# Patient Record
Sex: Female | Born: 1950 | Race: White | Hispanic: No | Smoking: Never smoker
Health system: Southern US, Community
[De-identification: ages and names within clinical notes are randomized; demographics above are authoritative.]

## PROBLEM LIST (undated history)

## (undated) DIAGNOSIS — I10 Essential (primary) hypertension: Secondary | ICD-10-CM

## (undated) DIAGNOSIS — E119 Type 2 diabetes mellitus without complications: Secondary | ICD-10-CM

## (undated) DIAGNOSIS — M1990 Unspecified osteoarthritis, unspecified site: Secondary | ICD-10-CM

## (undated) DIAGNOSIS — E669 Obesity, unspecified: Secondary | ICD-10-CM

## (undated) DIAGNOSIS — E7800 Pure hypercholesterolemia, unspecified: Secondary | ICD-10-CM

## (undated) DIAGNOSIS — N182 Chronic kidney disease, stage 2 (mild): Secondary | ICD-10-CM

## (undated) DIAGNOSIS — N39490 Overflow incontinence: Secondary | ICD-10-CM

## (undated) HISTORY — PX: TUBAL LIGATION: SHX77

## (undated) HISTORY — PX: CATARACT EXTRACTION W/ INTRAOCULAR LENS  IMPLANT, BILATERAL: SHX1307

## (undated) HISTORY — PX: TUMOR EXCISION: SHX421

## (undated) HISTORY — PX: ABDOMINAL HYSTERECTOMY: SHX81

## (undated) HISTORY — PX: CORONARY ANGIOPLASTY WITH STENT PLACEMENT: SHX49

## (undated) HISTORY — PX: DILATION AND CURETTAGE OF UTERUS: SHX78

## (undated) HISTORY — PX: CARPAL TUNNEL RELEASE: SHX101

---

## 2006-07-01 ENCOUNTER — Emergency Department (HOSPITAL_COMMUNITY): Admission: EM | Admit: 2006-07-01 | Discharge: 2006-07-01 | Payer: Self-pay | Admitting: Emergency Medicine

## 2010-11-15 LAB — DIFFERENTIAL
Basophils Absolute: 0
Eosinophils Absolute: 0.1
Eosinophils Relative: 1
Lymphocytes Relative: 29
Monocytes Absolute: 0.5

## 2010-11-15 LAB — BASIC METABOLIC PANEL
BUN: 12
CO2: 28
Chloride: 100
Glucose, Bld: 362 — ABNORMAL HIGH
Potassium: 4.1
Sodium: 134 — ABNORMAL LOW

## 2010-11-15 LAB — URINALYSIS, ROUTINE W REFLEX MICROSCOPIC
Bilirubin Urine: NEGATIVE
Ketones, ur: NEGATIVE
Nitrite: NEGATIVE
Protein, ur: NEGATIVE
pH: 7

## 2010-11-15 LAB — CBC
HCT: 41.7
Hemoglobin: 14.2
MCHC: 33.9
MCV: 84.5
Platelets: 225
RDW: 13.2

## 2014-04-21 DIAGNOSIS — E1021 Type 1 diabetes mellitus with diabetic nephropathy: Secondary | ICD-10-CM | POA: Diagnosis not present

## 2014-04-21 DIAGNOSIS — R251 Tremor, unspecified: Secondary | ICD-10-CM | POA: Diagnosis not present

## 2014-04-21 DIAGNOSIS — Z6841 Body Mass Index (BMI) 40.0 and over, adult: Secondary | ICD-10-CM | POA: Diagnosis not present

## 2014-04-21 DIAGNOSIS — I10 Essential (primary) hypertension: Secondary | ICD-10-CM | POA: Diagnosis not present

## 2014-04-21 DIAGNOSIS — J309 Allergic rhinitis, unspecified: Secondary | ICD-10-CM | POA: Diagnosis not present

## 2014-04-21 DIAGNOSIS — N183 Chronic kidney disease, stage 3 (moderate): Secondary | ICD-10-CM | POA: Diagnosis not present

## 2014-04-21 DIAGNOSIS — E780 Pure hypercholesterolemia: Secondary | ICD-10-CM | POA: Diagnosis not present

## 2014-04-21 DIAGNOSIS — D72829 Elevated white blood cell count, unspecified: Secondary | ICD-10-CM | POA: Diagnosis not present

## 2014-04-21 DIAGNOSIS — M159 Polyosteoarthritis, unspecified: Secondary | ICD-10-CM | POA: Diagnosis not present

## 2014-04-21 DIAGNOSIS — R609 Edema, unspecified: Secondary | ICD-10-CM | POA: Diagnosis not present

## 2014-04-21 DIAGNOSIS — R7989 Other specified abnormal findings of blood chemistry: Secondary | ICD-10-CM | POA: Diagnosis not present

## 2014-05-17 DIAGNOSIS — E1149 Type 2 diabetes mellitus with other diabetic neurological complication: Secondary | ICD-10-CM | POA: Diagnosis not present

## 2014-05-17 DIAGNOSIS — Z794 Long term (current) use of insulin: Secondary | ICD-10-CM | POA: Diagnosis not present

## 2014-05-17 DIAGNOSIS — E11649 Type 2 diabetes mellitus with hypoglycemia without coma: Secondary | ICD-10-CM | POA: Diagnosis not present

## 2014-05-17 DIAGNOSIS — I10 Essential (primary) hypertension: Secondary | ICD-10-CM | POA: Diagnosis not present

## 2014-05-17 DIAGNOSIS — E1165 Type 2 diabetes mellitus with hyperglycemia: Secondary | ICD-10-CM | POA: Diagnosis not present

## 2014-05-19 DIAGNOSIS — Z79899 Other long term (current) drug therapy: Secondary | ICD-10-CM | POA: Diagnosis not present

## 2014-05-19 DIAGNOSIS — R251 Tremor, unspecified: Secondary | ICD-10-CM | POA: Diagnosis not present

## 2014-06-03 DIAGNOSIS — N183 Chronic kidney disease, stage 3 (moderate): Secondary | ICD-10-CM | POA: Diagnosis not present

## 2014-06-21 DIAGNOSIS — I2510 Atherosclerotic heart disease of native coronary artery without angina pectoris: Secondary | ICD-10-CM | POA: Diagnosis not present

## 2014-06-21 DIAGNOSIS — E1121 Type 2 diabetes mellitus with diabetic nephropathy: Secondary | ICD-10-CM | POA: Diagnosis not present

## 2014-06-21 DIAGNOSIS — I10 Essential (primary) hypertension: Secondary | ICD-10-CM | POA: Diagnosis not present

## 2014-06-21 DIAGNOSIS — Z9181 History of falling: Secondary | ICD-10-CM | POA: Diagnosis not present

## 2014-06-21 DIAGNOSIS — R609 Edema, unspecified: Secondary | ICD-10-CM | POA: Diagnosis not present

## 2014-06-21 DIAGNOSIS — J309 Allergic rhinitis, unspecified: Secondary | ICD-10-CM | POA: Diagnosis not present

## 2014-06-21 DIAGNOSIS — Z1389 Encounter for screening for other disorder: Secondary | ICD-10-CM | POA: Diagnosis not present

## 2014-06-21 DIAGNOSIS — E780 Pure hypercholesterolemia: Secondary | ICD-10-CM | POA: Diagnosis not present

## 2014-06-21 DIAGNOSIS — N183 Chronic kidney disease, stage 3 (moderate): Secondary | ICD-10-CM | POA: Diagnosis not present

## 2014-06-21 DIAGNOSIS — R7989 Other specified abnormal findings of blood chemistry: Secondary | ICD-10-CM | POA: Diagnosis not present

## 2014-06-21 DIAGNOSIS — R251 Tremor, unspecified: Secondary | ICD-10-CM | POA: Diagnosis not present

## 2014-06-21 DIAGNOSIS — M159 Polyosteoarthritis, unspecified: Secondary | ICD-10-CM | POA: Diagnosis not present

## 2014-06-27 DIAGNOSIS — Z13820 Encounter for screening for osteoporosis: Secondary | ICD-10-CM | POA: Diagnosis not present

## 2014-06-27 DIAGNOSIS — M8589 Other specified disorders of bone density and structure, multiple sites: Secondary | ICD-10-CM | POA: Diagnosis not present

## 2014-07-01 DIAGNOSIS — N183 Chronic kidney disease, stage 3 (moderate): Secondary | ICD-10-CM | POA: Diagnosis not present

## 2014-07-25 DIAGNOSIS — E119 Type 2 diabetes mellitus without complications: Secondary | ICD-10-CM | POA: Diagnosis not present

## 2014-07-25 DIAGNOSIS — H25813 Combined forms of age-related cataract, bilateral: Secondary | ICD-10-CM | POA: Diagnosis not present

## 2014-07-25 DIAGNOSIS — H04123 Dry eye syndrome of bilateral lacrimal glands: Secondary | ICD-10-CM | POA: Diagnosis not present

## 2014-08-15 DIAGNOSIS — E780 Pure hypercholesterolemia: Secondary | ICD-10-CM | POA: Diagnosis not present

## 2014-08-15 DIAGNOSIS — I2510 Atherosclerotic heart disease of native coronary artery without angina pectoris: Secondary | ICD-10-CM | POA: Diagnosis not present

## 2014-08-15 DIAGNOSIS — I10 Essential (primary) hypertension: Secondary | ICD-10-CM | POA: Diagnosis not present

## 2014-08-15 DIAGNOSIS — E119 Type 2 diabetes mellitus without complications: Secondary | ICD-10-CM | POA: Diagnosis not present

## 2014-08-23 DIAGNOSIS — E1149 Type 2 diabetes mellitus with other diabetic neurological complication: Secondary | ICD-10-CM | POA: Diagnosis not present

## 2014-08-23 DIAGNOSIS — E1142 Type 2 diabetes mellitus with diabetic polyneuropathy: Secondary | ICD-10-CM | POA: Diagnosis not present

## 2014-08-23 DIAGNOSIS — E785 Hyperlipidemia, unspecified: Secondary | ICD-10-CM | POA: Diagnosis not present

## 2014-08-23 DIAGNOSIS — Z794 Long term (current) use of insulin: Secondary | ICD-10-CM | POA: Diagnosis not present

## 2014-08-23 DIAGNOSIS — I10 Essential (primary) hypertension: Secondary | ICD-10-CM | POA: Diagnosis not present

## 2014-08-25 DIAGNOSIS — R251 Tremor, unspecified: Secondary | ICD-10-CM | POA: Diagnosis not present

## 2014-09-21 DIAGNOSIS — M159 Polyosteoarthritis, unspecified: Secondary | ICD-10-CM | POA: Diagnosis not present

## 2014-09-21 DIAGNOSIS — E1121 Type 2 diabetes mellitus with diabetic nephropathy: Secondary | ICD-10-CM | POA: Diagnosis not present

## 2014-09-21 DIAGNOSIS — E780 Pure hypercholesterolemia: Secondary | ICD-10-CM | POA: Diagnosis not present

## 2014-09-21 DIAGNOSIS — I10 Essential (primary) hypertension: Secondary | ICD-10-CM | POA: Diagnosis not present

## 2014-09-21 DIAGNOSIS — R251 Tremor, unspecified: Secondary | ICD-10-CM | POA: Diagnosis not present

## 2014-09-21 DIAGNOSIS — Z6841 Body Mass Index (BMI) 40.0 and over, adult: Secondary | ICD-10-CM | POA: Diagnosis not present

## 2014-09-21 DIAGNOSIS — R7989 Other specified abnormal findings of blood chemistry: Secondary | ICD-10-CM | POA: Diagnosis not present

## 2014-09-21 DIAGNOSIS — J309 Allergic rhinitis, unspecified: Secondary | ICD-10-CM | POA: Diagnosis not present

## 2014-09-21 DIAGNOSIS — I2510 Atherosclerotic heart disease of native coronary artery without angina pectoris: Secondary | ICD-10-CM | POA: Diagnosis not present

## 2014-09-21 DIAGNOSIS — R609 Edema, unspecified: Secondary | ICD-10-CM | POA: Diagnosis not present

## 2014-09-21 DIAGNOSIS — N183 Chronic kidney disease, stage 3 (moderate): Secondary | ICD-10-CM | POA: Diagnosis not present

## 2014-09-30 DIAGNOSIS — I10 Essential (primary) hypertension: Secondary | ICD-10-CM | POA: Diagnosis not present

## 2014-11-10 DIAGNOSIS — E7800 Pure hypercholesterolemia, unspecified: Secondary | ICD-10-CM | POA: Diagnosis not present

## 2014-11-10 DIAGNOSIS — E1121 Type 2 diabetes mellitus with diabetic nephropathy: Secondary | ICD-10-CM | POA: Diagnosis not present

## 2014-11-10 DIAGNOSIS — J309 Allergic rhinitis, unspecified: Secondary | ICD-10-CM | POA: Diagnosis not present

## 2014-11-10 DIAGNOSIS — N61 Mastitis without abscess: Secondary | ICD-10-CM | POA: Diagnosis not present

## 2014-11-10 DIAGNOSIS — I2510 Atherosclerotic heart disease of native coronary artery without angina pectoris: Secondary | ICD-10-CM | POA: Diagnosis not present

## 2014-11-10 DIAGNOSIS — R251 Tremor, unspecified: Secondary | ICD-10-CM | POA: Diagnosis not present

## 2014-11-10 DIAGNOSIS — R7989 Other specified abnormal findings of blood chemistry: Secondary | ICD-10-CM | POA: Diagnosis not present

## 2014-11-10 DIAGNOSIS — Z23 Encounter for immunization: Secondary | ICD-10-CM | POA: Diagnosis not present

## 2014-11-10 DIAGNOSIS — M159 Polyosteoarthritis, unspecified: Secondary | ICD-10-CM | POA: Diagnosis not present

## 2014-11-10 DIAGNOSIS — R609 Edema, unspecified: Secondary | ICD-10-CM | POA: Diagnosis not present

## 2014-11-10 DIAGNOSIS — Z6841 Body Mass Index (BMI) 40.0 and over, adult: Secondary | ICD-10-CM | POA: Diagnosis not present

## 2014-11-24 DIAGNOSIS — R609 Edema, unspecified: Secondary | ICD-10-CM | POA: Diagnosis not present

## 2014-11-24 DIAGNOSIS — Z6841 Body Mass Index (BMI) 40.0 and over, adult: Secondary | ICD-10-CM | POA: Diagnosis not present

## 2014-11-24 DIAGNOSIS — N183 Chronic kidney disease, stage 3 (moderate): Secondary | ICD-10-CM | POA: Diagnosis not present

## 2014-11-24 DIAGNOSIS — M159 Polyosteoarthritis, unspecified: Secondary | ICD-10-CM | POA: Diagnosis not present

## 2014-11-24 DIAGNOSIS — R7989 Other specified abnormal findings of blood chemistry: Secondary | ICD-10-CM | POA: Diagnosis not present

## 2014-11-24 DIAGNOSIS — I10 Essential (primary) hypertension: Secondary | ICD-10-CM | POA: Diagnosis not present

## 2014-11-24 DIAGNOSIS — J309 Allergic rhinitis, unspecified: Secondary | ICD-10-CM | POA: Diagnosis not present

## 2014-11-24 DIAGNOSIS — R251 Tremor, unspecified: Secondary | ICD-10-CM | POA: Diagnosis not present

## 2014-11-24 DIAGNOSIS — E1121 Type 2 diabetes mellitus with diabetic nephropathy: Secondary | ICD-10-CM | POA: Diagnosis not present

## 2014-11-24 DIAGNOSIS — N61 Mastitis without abscess: Secondary | ICD-10-CM | POA: Diagnosis not present

## 2014-11-24 DIAGNOSIS — I2510 Atherosclerotic heart disease of native coronary artery without angina pectoris: Secondary | ICD-10-CM | POA: Diagnosis not present

## 2014-11-24 DIAGNOSIS — E7800 Pure hypercholesterolemia, unspecified: Secondary | ICD-10-CM | POA: Diagnosis not present

## 2014-12-05 DIAGNOSIS — Z1231 Encounter for screening mammogram for malignant neoplasm of breast: Secondary | ICD-10-CM | POA: Diagnosis not present

## 2014-12-19 DIAGNOSIS — R251 Tremor, unspecified: Secondary | ICD-10-CM | POA: Diagnosis not present

## 2014-12-19 DIAGNOSIS — R609 Edema, unspecified: Secondary | ICD-10-CM | POA: Diagnosis not present

## 2014-12-19 DIAGNOSIS — I10 Essential (primary) hypertension: Secondary | ICD-10-CM | POA: Diagnosis not present

## 2014-12-19 DIAGNOSIS — N183 Chronic kidney disease, stage 3 (moderate): Secondary | ICD-10-CM | POA: Diagnosis not present

## 2014-12-19 DIAGNOSIS — Z6841 Body Mass Index (BMI) 40.0 and over, adult: Secondary | ICD-10-CM | POA: Diagnosis not present

## 2014-12-19 DIAGNOSIS — R7989 Other specified abnormal findings of blood chemistry: Secondary | ICD-10-CM | POA: Diagnosis not present

## 2014-12-19 DIAGNOSIS — J309 Allergic rhinitis, unspecified: Secondary | ICD-10-CM | POA: Diagnosis not present

## 2014-12-19 DIAGNOSIS — E1121 Type 2 diabetes mellitus with diabetic nephropathy: Secondary | ICD-10-CM | POA: Diagnosis not present

## 2014-12-19 DIAGNOSIS — M159 Polyosteoarthritis, unspecified: Secondary | ICD-10-CM | POA: Diagnosis not present

## 2014-12-19 DIAGNOSIS — E7800 Pure hypercholesterolemia, unspecified: Secondary | ICD-10-CM | POA: Diagnosis not present

## 2014-12-19 DIAGNOSIS — I2510 Atherosclerotic heart disease of native coronary artery without angina pectoris: Secondary | ICD-10-CM | POA: Diagnosis not present

## 2014-12-29 DIAGNOSIS — Z794 Long term (current) use of insulin: Secondary | ICD-10-CM | POA: Diagnosis not present

## 2014-12-29 DIAGNOSIS — E785 Hyperlipidemia, unspecified: Secondary | ICD-10-CM | POA: Diagnosis not present

## 2014-12-29 DIAGNOSIS — I10 Essential (primary) hypertension: Secondary | ICD-10-CM | POA: Diagnosis not present

## 2014-12-29 DIAGNOSIS — E1165 Type 2 diabetes mellitus with hyperglycemia: Secondary | ICD-10-CM | POA: Diagnosis not present

## 2015-01-31 DIAGNOSIS — I10 Essential (primary) hypertension: Secondary | ICD-10-CM | POA: Diagnosis not present

## 2015-02-02 DIAGNOSIS — H25813 Combined forms of age-related cataract, bilateral: Secondary | ICD-10-CM | POA: Diagnosis not present

## 2015-02-02 DIAGNOSIS — E113293 Type 2 diabetes mellitus with mild nonproliferative diabetic retinopathy without macular edema, bilateral: Secondary | ICD-10-CM | POA: Diagnosis not present

## 2015-03-17 DIAGNOSIS — H25812 Combined forms of age-related cataract, left eye: Secondary | ICD-10-CM | POA: Diagnosis not present

## 2015-03-17 DIAGNOSIS — H40033 Anatomical narrow angle, bilateral: Secondary | ICD-10-CM | POA: Diagnosis not present

## 2015-03-21 DIAGNOSIS — R609 Edema, unspecified: Secondary | ICD-10-CM | POA: Diagnosis not present

## 2015-03-21 DIAGNOSIS — I10 Essential (primary) hypertension: Secondary | ICD-10-CM | POA: Diagnosis not present

## 2015-03-21 DIAGNOSIS — R251 Tremor, unspecified: Secondary | ICD-10-CM | POA: Diagnosis not present

## 2015-03-21 DIAGNOSIS — E1121 Type 2 diabetes mellitus with diabetic nephropathy: Secondary | ICD-10-CM | POA: Diagnosis not present

## 2015-03-21 DIAGNOSIS — Z9181 History of falling: Secondary | ICD-10-CM | POA: Diagnosis not present

## 2015-03-21 DIAGNOSIS — Z1389 Encounter for screening for other disorder: Secondary | ICD-10-CM | POA: Diagnosis not present

## 2015-03-21 DIAGNOSIS — N183 Chronic kidney disease, stage 3 (moderate): Secondary | ICD-10-CM | POA: Diagnosis not present

## 2015-03-21 DIAGNOSIS — J309 Allergic rhinitis, unspecified: Secondary | ICD-10-CM | POA: Diagnosis not present

## 2015-03-21 DIAGNOSIS — M159 Polyosteoarthritis, unspecified: Secondary | ICD-10-CM | POA: Diagnosis not present

## 2015-03-21 DIAGNOSIS — R945 Abnormal results of liver function studies: Secondary | ICD-10-CM | POA: Diagnosis not present

## 2015-03-21 DIAGNOSIS — E7800 Pure hypercholesterolemia, unspecified: Secondary | ICD-10-CM | POA: Diagnosis not present

## 2015-03-21 DIAGNOSIS — I2510 Atherosclerotic heart disease of native coronary artery without angina pectoris: Secondary | ICD-10-CM | POA: Diagnosis not present

## 2015-04-05 DIAGNOSIS — I10 Essential (primary) hypertension: Secondary | ICD-10-CM | POA: Diagnosis not present

## 2015-04-05 DIAGNOSIS — E1142 Type 2 diabetes mellitus with diabetic polyneuropathy: Secondary | ICD-10-CM | POA: Diagnosis not present

## 2015-04-05 DIAGNOSIS — E782 Mixed hyperlipidemia: Secondary | ICD-10-CM | POA: Diagnosis not present

## 2015-04-05 DIAGNOSIS — Z6841 Body Mass Index (BMI) 40.0 and over, adult: Secondary | ICD-10-CM | POA: Diagnosis not present

## 2015-04-05 DIAGNOSIS — Z794 Long term (current) use of insulin: Secondary | ICD-10-CM | POA: Diagnosis not present

## 2015-04-11 DIAGNOSIS — N183 Chronic kidney disease, stage 3 (moderate): Secondary | ICD-10-CM | POA: Diagnosis not present

## 2015-04-11 DIAGNOSIS — E785 Hyperlipidemia, unspecified: Secondary | ICD-10-CM | POA: Diagnosis not present

## 2015-04-11 DIAGNOSIS — M1990 Unspecified osteoarthritis, unspecified site: Secondary | ICD-10-CM | POA: Diagnosis not present

## 2015-04-11 DIAGNOSIS — I129 Hypertensive chronic kidney disease with stage 1 through stage 4 chronic kidney disease, or unspecified chronic kidney disease: Secondary | ICD-10-CM | POA: Diagnosis not present

## 2015-04-11 DIAGNOSIS — M810 Age-related osteoporosis without current pathological fracture: Secondary | ICD-10-CM | POA: Diagnosis not present

## 2015-04-11 DIAGNOSIS — Z79899 Other long term (current) drug therapy: Secondary | ICD-10-CM | POA: Diagnosis not present

## 2015-04-11 DIAGNOSIS — Z7984 Long term (current) use of oral hypoglycemic drugs: Secondary | ICD-10-CM | POA: Diagnosis not present

## 2015-04-11 DIAGNOSIS — I2510 Atherosclerotic heart disease of native coronary artery without angina pectoris: Secondary | ICD-10-CM | POA: Diagnosis not present

## 2015-04-11 DIAGNOSIS — Z8673 Personal history of transient ischemic attack (TIA), and cerebral infarction without residual deficits: Secondary | ICD-10-CM | POA: Diagnosis not present

## 2015-04-11 DIAGNOSIS — E1140 Type 2 diabetes mellitus with diabetic neuropathy, unspecified: Secondary | ICD-10-CM | POA: Diagnosis not present

## 2015-04-11 DIAGNOSIS — H25812 Combined forms of age-related cataract, left eye: Secondary | ICD-10-CM | POA: Diagnosis not present

## 2015-04-13 DIAGNOSIS — I10 Essential (primary) hypertension: Secondary | ICD-10-CM | POA: Diagnosis not present

## 2015-04-13 DIAGNOSIS — R251 Tremor, unspecified: Secondary | ICD-10-CM | POA: Diagnosis not present

## 2015-04-13 DIAGNOSIS — M79604 Pain in right leg: Secondary | ICD-10-CM | POA: Diagnosis not present

## 2015-04-13 DIAGNOSIS — J309 Allergic rhinitis, unspecified: Secondary | ICD-10-CM | POA: Diagnosis not present

## 2015-04-13 DIAGNOSIS — N183 Chronic kidney disease, stage 3 (moderate): Secondary | ICD-10-CM | POA: Diagnosis not present

## 2015-04-13 DIAGNOSIS — E7800 Pure hypercholesterolemia, unspecified: Secondary | ICD-10-CM | POA: Diagnosis not present

## 2015-04-13 DIAGNOSIS — M79661 Pain in right lower leg: Secondary | ICD-10-CM | POA: Diagnosis not present

## 2015-04-13 DIAGNOSIS — R945 Abnormal results of liver function studies: Secondary | ICD-10-CM | POA: Diagnosis not present

## 2015-04-13 DIAGNOSIS — E1121 Type 2 diabetes mellitus with diabetic nephropathy: Secondary | ICD-10-CM | POA: Diagnosis not present

## 2015-04-13 DIAGNOSIS — E1140 Type 2 diabetes mellitus with diabetic neuropathy, unspecified: Secondary | ICD-10-CM | POA: Diagnosis not present

## 2015-04-13 DIAGNOSIS — I2510 Atherosclerotic heart disease of native coronary artery without angina pectoris: Secondary | ICD-10-CM | POA: Diagnosis not present

## 2015-04-13 DIAGNOSIS — M79651 Pain in right thigh: Secondary | ICD-10-CM | POA: Diagnosis not present

## 2015-04-13 DIAGNOSIS — R609 Edema, unspecified: Secondary | ICD-10-CM | POA: Diagnosis not present

## 2015-04-13 DIAGNOSIS — M159 Polyosteoarthritis, unspecified: Secondary | ICD-10-CM | POA: Diagnosis not present

## 2015-04-18 DIAGNOSIS — H353131 Nonexudative age-related macular degeneration, bilateral, early dry stage: Secondary | ICD-10-CM | POA: Diagnosis not present

## 2015-04-20 DIAGNOSIS — R609 Edema, unspecified: Secondary | ICD-10-CM | POA: Diagnosis not present

## 2015-04-20 DIAGNOSIS — J309 Allergic rhinitis, unspecified: Secondary | ICD-10-CM | POA: Diagnosis not present

## 2015-04-20 DIAGNOSIS — I10 Essential (primary) hypertension: Secondary | ICD-10-CM | POA: Diagnosis not present

## 2015-04-20 DIAGNOSIS — E7800 Pure hypercholesterolemia, unspecified: Secondary | ICD-10-CM | POA: Diagnosis not present

## 2015-04-20 DIAGNOSIS — R945 Abnormal results of liver function studies: Secondary | ICD-10-CM | POA: Diagnosis not present

## 2015-04-20 DIAGNOSIS — I2510 Atherosclerotic heart disease of native coronary artery without angina pectoris: Secondary | ICD-10-CM | POA: Diagnosis not present

## 2015-04-20 DIAGNOSIS — R251 Tremor, unspecified: Secondary | ICD-10-CM | POA: Diagnosis not present

## 2015-04-20 DIAGNOSIS — M25561 Pain in right knee: Secondary | ICD-10-CM | POA: Diagnosis not present

## 2015-04-20 DIAGNOSIS — E1121 Type 2 diabetes mellitus with diabetic nephropathy: Secondary | ICD-10-CM | POA: Diagnosis not present

## 2015-04-20 DIAGNOSIS — E1140 Type 2 diabetes mellitus with diabetic neuropathy, unspecified: Secondary | ICD-10-CM | POA: Diagnosis not present

## 2015-04-20 DIAGNOSIS — M159 Polyosteoarthritis, unspecified: Secondary | ICD-10-CM | POA: Diagnosis not present

## 2015-04-20 DIAGNOSIS — N183 Chronic kidney disease, stage 3 (moderate): Secondary | ICD-10-CM | POA: Diagnosis not present

## 2015-04-24 DIAGNOSIS — M1711 Unilateral primary osteoarthritis, right knee: Secondary | ICD-10-CM | POA: Diagnosis not present

## 2015-05-09 DIAGNOSIS — Z79899 Other long term (current) drug therapy: Secondary | ICD-10-CM | POA: Diagnosis not present

## 2015-05-09 DIAGNOSIS — N183 Chronic kidney disease, stage 3 (moderate): Secondary | ICD-10-CM | POA: Diagnosis not present

## 2015-05-09 DIAGNOSIS — Z8673 Personal history of transient ischemic attack (TIA), and cerebral infarction without residual deficits: Secondary | ICD-10-CM | POA: Diagnosis not present

## 2015-05-09 DIAGNOSIS — Z955 Presence of coronary angioplasty implant and graft: Secondary | ICD-10-CM | POA: Diagnosis not present

## 2015-05-09 DIAGNOSIS — E1142 Type 2 diabetes mellitus with diabetic polyneuropathy: Secondary | ICD-10-CM | POA: Diagnosis not present

## 2015-05-09 DIAGNOSIS — M810 Age-related osteoporosis without current pathological fracture: Secondary | ICD-10-CM | POA: Diagnosis not present

## 2015-05-09 DIAGNOSIS — H25811 Combined forms of age-related cataract, right eye: Secondary | ICD-10-CM | POA: Diagnosis not present

## 2015-05-09 DIAGNOSIS — M1990 Unspecified osteoarthritis, unspecified site: Secondary | ICD-10-CM | POA: Diagnosis not present

## 2015-05-09 DIAGNOSIS — Z7984 Long term (current) use of oral hypoglycemic drugs: Secondary | ICD-10-CM | POA: Diagnosis not present

## 2015-05-09 DIAGNOSIS — E785 Hyperlipidemia, unspecified: Secondary | ICD-10-CM | POA: Diagnosis not present

## 2015-05-09 DIAGNOSIS — I129 Hypertensive chronic kidney disease with stage 1 through stage 4 chronic kidney disease, or unspecified chronic kidney disease: Secondary | ICD-10-CM | POA: Diagnosis not present

## 2015-05-09 DIAGNOSIS — I2510 Atherosclerotic heart disease of native coronary artery without angina pectoris: Secondary | ICD-10-CM | POA: Diagnosis not present

## 2015-05-16 DIAGNOSIS — E7800 Pure hypercholesterolemia, unspecified: Secondary | ICD-10-CM | POA: Diagnosis not present

## 2015-05-16 DIAGNOSIS — R945 Abnormal results of liver function studies: Secondary | ICD-10-CM | POA: Diagnosis not present

## 2015-05-16 DIAGNOSIS — E1140 Type 2 diabetes mellitus with diabetic neuropathy, unspecified: Secondary | ICD-10-CM | POA: Diagnosis not present

## 2015-05-16 DIAGNOSIS — J309 Allergic rhinitis, unspecified: Secondary | ICD-10-CM | POA: Diagnosis not present

## 2015-05-16 DIAGNOSIS — N183 Chronic kidney disease, stage 3 (moderate): Secondary | ICD-10-CM | POA: Diagnosis not present

## 2015-05-16 DIAGNOSIS — M159 Polyosteoarthritis, unspecified: Secondary | ICD-10-CM | POA: Diagnosis not present

## 2015-05-16 DIAGNOSIS — R251 Tremor, unspecified: Secondary | ICD-10-CM | POA: Diagnosis not present

## 2015-05-16 DIAGNOSIS — E1121 Type 2 diabetes mellitus with diabetic nephropathy: Secondary | ICD-10-CM | POA: Diagnosis not present

## 2015-05-16 DIAGNOSIS — I2510 Atherosclerotic heart disease of native coronary artery without angina pectoris: Secondary | ICD-10-CM | POA: Diagnosis not present

## 2015-05-16 DIAGNOSIS — R609 Edema, unspecified: Secondary | ICD-10-CM | POA: Diagnosis not present

## 2015-05-16 DIAGNOSIS — I10 Essential (primary) hypertension: Secondary | ICD-10-CM | POA: Diagnosis not present

## 2015-05-31 DIAGNOSIS — I10 Essential (primary) hypertension: Secondary | ICD-10-CM | POA: Diagnosis not present

## 2015-06-08 DIAGNOSIS — H524 Presbyopia: Secondary | ICD-10-CM | POA: Diagnosis not present

## 2015-06-13 DIAGNOSIS — I10 Essential (primary) hypertension: Secondary | ICD-10-CM | POA: Diagnosis not present

## 2015-06-13 DIAGNOSIS — E1140 Type 2 diabetes mellitus with diabetic neuropathy, unspecified: Secondary | ICD-10-CM | POA: Diagnosis not present

## 2015-06-13 DIAGNOSIS — E1121 Type 2 diabetes mellitus with diabetic nephropathy: Secondary | ICD-10-CM | POA: Diagnosis not present

## 2015-06-13 DIAGNOSIS — E7800 Pure hypercholesterolemia, unspecified: Secondary | ICD-10-CM | POA: Diagnosis not present

## 2015-06-13 DIAGNOSIS — N183 Chronic kidney disease, stage 3 (moderate): Secondary | ICD-10-CM | POA: Diagnosis not present

## 2015-06-13 DIAGNOSIS — Z6841 Body Mass Index (BMI) 40.0 and over, adult: Secondary | ICD-10-CM | POA: Diagnosis not present

## 2015-06-13 DIAGNOSIS — J309 Allergic rhinitis, unspecified: Secondary | ICD-10-CM | POA: Diagnosis not present

## 2015-06-13 DIAGNOSIS — M159 Polyosteoarthritis, unspecified: Secondary | ICD-10-CM | POA: Diagnosis not present

## 2015-06-13 DIAGNOSIS — I2510 Atherosclerotic heart disease of native coronary artery without angina pectoris: Secondary | ICD-10-CM | POA: Diagnosis not present

## 2015-06-13 DIAGNOSIS — R609 Edema, unspecified: Secondary | ICD-10-CM | POA: Diagnosis not present

## 2015-06-13 DIAGNOSIS — R945 Abnormal results of liver function studies: Secondary | ICD-10-CM | POA: Diagnosis not present

## 2015-06-13 DIAGNOSIS — R251 Tremor, unspecified: Secondary | ICD-10-CM | POA: Diagnosis not present

## 2015-07-11 DIAGNOSIS — E119 Type 2 diabetes mellitus without complications: Secondary | ICD-10-CM | POA: Diagnosis not present

## 2015-07-11 DIAGNOSIS — I10 Essential (primary) hypertension: Secondary | ICD-10-CM | POA: Diagnosis not present

## 2015-07-11 DIAGNOSIS — Z794 Long term (current) use of insulin: Secondary | ICD-10-CM | POA: Diagnosis not present

## 2015-07-11 DIAGNOSIS — I5022 Chronic systolic (congestive) heart failure: Secondary | ICD-10-CM | POA: Diagnosis not present

## 2015-07-11 DIAGNOSIS — E7800 Pure hypercholesterolemia, unspecified: Secondary | ICD-10-CM | POA: Diagnosis not present

## 2015-07-11 DIAGNOSIS — I2510 Atherosclerotic heart disease of native coronary artery without angina pectoris: Secondary | ICD-10-CM | POA: Diagnosis not present

## 2015-07-12 DIAGNOSIS — E7800 Pure hypercholesterolemia, unspecified: Secondary | ICD-10-CM | POA: Diagnosis not present

## 2015-07-12 DIAGNOSIS — I2510 Atherosclerotic heart disease of native coronary artery without angina pectoris: Secondary | ICD-10-CM | POA: Diagnosis not present

## 2015-07-12 DIAGNOSIS — I10 Essential (primary) hypertension: Secondary | ICD-10-CM | POA: Diagnosis not present

## 2015-07-12 DIAGNOSIS — E119 Type 2 diabetes mellitus without complications: Secondary | ICD-10-CM | POA: Diagnosis not present

## 2015-07-12 DIAGNOSIS — Z794 Long term (current) use of insulin: Secondary | ICD-10-CM | POA: Diagnosis not present

## 2015-07-18 DIAGNOSIS — R609 Edema, unspecified: Secondary | ICD-10-CM | POA: Diagnosis not present

## 2015-07-18 DIAGNOSIS — Z6841 Body Mass Index (BMI) 40.0 and over, adult: Secondary | ICD-10-CM | POA: Diagnosis not present

## 2015-07-18 DIAGNOSIS — E1121 Type 2 diabetes mellitus with diabetic nephropathy: Secondary | ICD-10-CM | POA: Diagnosis not present

## 2015-07-18 DIAGNOSIS — R945 Abnormal results of liver function studies: Secondary | ICD-10-CM | POA: Diagnosis not present

## 2015-07-18 DIAGNOSIS — I10 Essential (primary) hypertension: Secondary | ICD-10-CM | POA: Diagnosis not present

## 2015-07-18 DIAGNOSIS — E1140 Type 2 diabetes mellitus with diabetic neuropathy, unspecified: Secondary | ICD-10-CM | POA: Diagnosis not present

## 2015-07-18 DIAGNOSIS — R251 Tremor, unspecified: Secondary | ICD-10-CM | POA: Diagnosis not present

## 2015-07-18 DIAGNOSIS — J309 Allergic rhinitis, unspecified: Secondary | ICD-10-CM | POA: Diagnosis not present

## 2015-07-18 DIAGNOSIS — M159 Polyosteoarthritis, unspecified: Secondary | ICD-10-CM | POA: Diagnosis not present

## 2015-07-18 DIAGNOSIS — E7800 Pure hypercholesterolemia, unspecified: Secondary | ICD-10-CM | POA: Diagnosis not present

## 2015-07-18 DIAGNOSIS — N183 Chronic kidney disease, stage 3 (moderate): Secondary | ICD-10-CM | POA: Diagnosis not present

## 2015-07-18 DIAGNOSIS — I2510 Atherosclerotic heart disease of native coronary artery without angina pectoris: Secondary | ICD-10-CM | POA: Diagnosis not present

## 2015-08-07 DIAGNOSIS — M170 Bilateral primary osteoarthritis of knee: Secondary | ICD-10-CM | POA: Diagnosis not present

## 2015-08-15 DIAGNOSIS — M1712 Unilateral primary osteoarthritis, left knee: Secondary | ICD-10-CM | POA: Diagnosis not present

## 2015-08-15 DIAGNOSIS — M1711 Unilateral primary osteoarthritis, right knee: Secondary | ICD-10-CM | POA: Diagnosis not present

## 2015-08-17 DIAGNOSIS — I10 Essential (primary) hypertension: Secondary | ICD-10-CM | POA: Diagnosis not present

## 2015-08-17 DIAGNOSIS — N183 Chronic kidney disease, stage 3 (moderate): Secondary | ICD-10-CM | POA: Diagnosis not present

## 2015-08-17 DIAGNOSIS — M159 Polyosteoarthritis, unspecified: Secondary | ICD-10-CM | POA: Diagnosis not present

## 2015-08-17 DIAGNOSIS — R7989 Other specified abnormal findings of blood chemistry: Secondary | ICD-10-CM | POA: Diagnosis not present

## 2015-08-17 DIAGNOSIS — E1121 Type 2 diabetes mellitus with diabetic nephropathy: Secondary | ICD-10-CM | POA: Diagnosis not present

## 2015-08-17 DIAGNOSIS — J309 Allergic rhinitis, unspecified: Secondary | ICD-10-CM | POA: Diagnosis not present

## 2015-08-17 DIAGNOSIS — R609 Edema, unspecified: Secondary | ICD-10-CM | POA: Diagnosis not present

## 2015-08-17 DIAGNOSIS — I2510 Atherosclerotic heart disease of native coronary artery without angina pectoris: Secondary | ICD-10-CM | POA: Diagnosis not present

## 2015-08-17 DIAGNOSIS — E7800 Pure hypercholesterolemia, unspecified: Secondary | ICD-10-CM | POA: Diagnosis not present

## 2015-08-17 DIAGNOSIS — E1140 Type 2 diabetes mellitus with diabetic neuropathy, unspecified: Secondary | ICD-10-CM | POA: Diagnosis not present

## 2015-08-17 DIAGNOSIS — R251 Tremor, unspecified: Secondary | ICD-10-CM | POA: Diagnosis not present

## 2015-08-24 DIAGNOSIS — E119 Type 2 diabetes mellitus without complications: Secondary | ICD-10-CM | POA: Diagnosis not present

## 2015-08-24 DIAGNOSIS — E7800 Pure hypercholesterolemia, unspecified: Secondary | ICD-10-CM | POA: Diagnosis not present

## 2015-08-24 DIAGNOSIS — I2510 Atherosclerotic heart disease of native coronary artery without angina pectoris: Secondary | ICD-10-CM | POA: Diagnosis not present

## 2015-08-24 DIAGNOSIS — I10 Essential (primary) hypertension: Secondary | ICD-10-CM | POA: Diagnosis not present

## 2015-08-28 DIAGNOSIS — E782 Mixed hyperlipidemia: Secondary | ICD-10-CM | POA: Diagnosis not present

## 2015-08-28 DIAGNOSIS — E1142 Type 2 diabetes mellitus with diabetic polyneuropathy: Secondary | ICD-10-CM | POA: Diagnosis not present

## 2015-08-28 DIAGNOSIS — I10 Essential (primary) hypertension: Secondary | ICD-10-CM | POA: Diagnosis not present

## 2015-08-28 DIAGNOSIS — Z794 Long term (current) use of insulin: Secondary | ICD-10-CM | POA: Diagnosis not present

## 2015-09-14 DIAGNOSIS — J309 Allergic rhinitis, unspecified: Secondary | ICD-10-CM | POA: Diagnosis not present

## 2015-09-14 DIAGNOSIS — N183 Chronic kidney disease, stage 3 (moderate): Secondary | ICD-10-CM | POA: Diagnosis not present

## 2015-09-14 DIAGNOSIS — E1140 Type 2 diabetes mellitus with diabetic neuropathy, unspecified: Secondary | ICD-10-CM | POA: Diagnosis not present

## 2015-09-14 DIAGNOSIS — E7800 Pure hypercholesterolemia, unspecified: Secondary | ICD-10-CM | POA: Diagnosis not present

## 2015-09-14 DIAGNOSIS — R609 Edema, unspecified: Secondary | ICD-10-CM | POA: Diagnosis not present

## 2015-09-14 DIAGNOSIS — E1121 Type 2 diabetes mellitus with diabetic nephropathy: Secondary | ICD-10-CM | POA: Diagnosis not present

## 2015-09-14 DIAGNOSIS — Z6841 Body Mass Index (BMI) 40.0 and over, adult: Secondary | ICD-10-CM | POA: Diagnosis not present

## 2015-09-14 DIAGNOSIS — I10 Essential (primary) hypertension: Secondary | ICD-10-CM | POA: Diagnosis not present

## 2015-09-14 DIAGNOSIS — R251 Tremor, unspecified: Secondary | ICD-10-CM | POA: Diagnosis not present

## 2015-09-14 DIAGNOSIS — M159 Polyosteoarthritis, unspecified: Secondary | ICD-10-CM | POA: Diagnosis not present

## 2015-09-14 DIAGNOSIS — R7989 Other specified abnormal findings of blood chemistry: Secondary | ICD-10-CM | POA: Diagnosis not present

## 2015-09-14 DIAGNOSIS — I2510 Atherosclerotic heart disease of native coronary artery without angina pectoris: Secondary | ICD-10-CM | POA: Diagnosis not present

## 2015-09-20 DIAGNOSIS — M7122 Synovial cyst of popliteal space [Baker], left knee: Secondary | ICD-10-CM | POA: Diagnosis not present

## 2015-09-20 DIAGNOSIS — R609 Edema, unspecified: Secondary | ICD-10-CM | POA: Diagnosis not present

## 2015-09-20 DIAGNOSIS — M7121 Synovial cyst of popliteal space [Baker], right knee: Secondary | ICD-10-CM | POA: Diagnosis not present

## 2015-09-20 DIAGNOSIS — M7989 Other specified soft tissue disorders: Secondary | ICD-10-CM | POA: Diagnosis not present

## 2015-10-03 DIAGNOSIS — I10 Essential (primary) hypertension: Secondary | ICD-10-CM | POA: Diagnosis not present

## 2015-10-12 DIAGNOSIS — E1140 Type 2 diabetes mellitus with diabetic neuropathy, unspecified: Secondary | ICD-10-CM | POA: Diagnosis not present

## 2015-10-12 DIAGNOSIS — R251 Tremor, unspecified: Secondary | ICD-10-CM | POA: Diagnosis not present

## 2015-10-12 DIAGNOSIS — E7800 Pure hypercholesterolemia, unspecified: Secondary | ICD-10-CM | POA: Diagnosis not present

## 2015-10-12 DIAGNOSIS — E1121 Type 2 diabetes mellitus with diabetic nephropathy: Secondary | ICD-10-CM | POA: Diagnosis not present

## 2015-10-12 DIAGNOSIS — R7989 Other specified abnormal findings of blood chemistry: Secondary | ICD-10-CM | POA: Diagnosis not present

## 2015-10-12 DIAGNOSIS — R609 Edema, unspecified: Secondary | ICD-10-CM | POA: Diagnosis not present

## 2015-10-12 DIAGNOSIS — I2510 Atherosclerotic heart disease of native coronary artery without angina pectoris: Secondary | ICD-10-CM | POA: Diagnosis not present

## 2015-10-12 DIAGNOSIS — J309 Allergic rhinitis, unspecified: Secondary | ICD-10-CM | POA: Diagnosis not present

## 2015-10-12 DIAGNOSIS — I10 Essential (primary) hypertension: Secondary | ICD-10-CM | POA: Diagnosis not present

## 2015-10-12 DIAGNOSIS — M159 Polyosteoarthritis, unspecified: Secondary | ICD-10-CM | POA: Diagnosis not present

## 2015-10-12 DIAGNOSIS — N183 Chronic kidney disease, stage 3 (moderate): Secondary | ICD-10-CM | POA: Diagnosis not present

## 2015-10-12 DIAGNOSIS — Z23 Encounter for immunization: Secondary | ICD-10-CM | POA: Diagnosis not present

## 2015-11-09 DIAGNOSIS — E7800 Pure hypercholesterolemia, unspecified: Secondary | ICD-10-CM | POA: Diagnosis not present

## 2015-11-09 DIAGNOSIS — R251 Tremor, unspecified: Secondary | ICD-10-CM | POA: Diagnosis not present

## 2015-11-09 DIAGNOSIS — I10 Essential (primary) hypertension: Secondary | ICD-10-CM | POA: Diagnosis not present

## 2015-11-09 DIAGNOSIS — R609 Edema, unspecified: Secondary | ICD-10-CM | POA: Diagnosis not present

## 2015-11-09 DIAGNOSIS — J309 Allergic rhinitis, unspecified: Secondary | ICD-10-CM | POA: Diagnosis not present

## 2015-11-09 DIAGNOSIS — I2510 Atherosclerotic heart disease of native coronary artery without angina pectoris: Secondary | ICD-10-CM | POA: Diagnosis not present

## 2015-11-09 DIAGNOSIS — E1121 Type 2 diabetes mellitus with diabetic nephropathy: Secondary | ICD-10-CM | POA: Diagnosis not present

## 2015-11-09 DIAGNOSIS — N183 Chronic kidney disease, stage 3 (moderate): Secondary | ICD-10-CM | POA: Diagnosis not present

## 2015-11-09 DIAGNOSIS — M159 Polyosteoarthritis, unspecified: Secondary | ICD-10-CM | POA: Diagnosis not present

## 2015-11-09 DIAGNOSIS — R7989 Other specified abnormal findings of blood chemistry: Secondary | ICD-10-CM | POA: Diagnosis not present

## 2015-11-09 DIAGNOSIS — E1140 Type 2 diabetes mellitus with diabetic neuropathy, unspecified: Secondary | ICD-10-CM | POA: Diagnosis not present

## 2015-12-11 DIAGNOSIS — E7800 Pure hypercholesterolemia, unspecified: Secondary | ICD-10-CM | POA: Diagnosis not present

## 2015-12-11 DIAGNOSIS — N183 Chronic kidney disease, stage 3 (moderate): Secondary | ICD-10-CM | POA: Diagnosis not present

## 2015-12-11 DIAGNOSIS — R609 Edema, unspecified: Secondary | ICD-10-CM | POA: Diagnosis not present

## 2015-12-11 DIAGNOSIS — E1121 Type 2 diabetes mellitus with diabetic nephropathy: Secondary | ICD-10-CM | POA: Diagnosis not present

## 2015-12-11 DIAGNOSIS — R251 Tremor, unspecified: Secondary | ICD-10-CM | POA: Diagnosis not present

## 2015-12-11 DIAGNOSIS — E1140 Type 2 diabetes mellitus with diabetic neuropathy, unspecified: Secondary | ICD-10-CM | POA: Diagnosis not present

## 2015-12-11 DIAGNOSIS — J309 Allergic rhinitis, unspecified: Secondary | ICD-10-CM | POA: Diagnosis not present

## 2015-12-11 DIAGNOSIS — I2510 Atherosclerotic heart disease of native coronary artery without angina pectoris: Secondary | ICD-10-CM | POA: Diagnosis not present

## 2015-12-11 DIAGNOSIS — I10 Essential (primary) hypertension: Secondary | ICD-10-CM | POA: Diagnosis not present

## 2015-12-11 DIAGNOSIS — R7989 Other specified abnormal findings of blood chemistry: Secondary | ICD-10-CM | POA: Diagnosis not present

## 2015-12-11 DIAGNOSIS — M159 Polyosteoarthritis, unspecified: Secondary | ICD-10-CM | POA: Diagnosis not present

## 2015-12-12 DIAGNOSIS — H26493 Other secondary cataract, bilateral: Secondary | ICD-10-CM | POA: Diagnosis not present

## 2015-12-12 DIAGNOSIS — H353131 Nonexudative age-related macular degeneration, bilateral, early dry stage: Secondary | ICD-10-CM | POA: Diagnosis not present

## 2015-12-19 DIAGNOSIS — H26492 Other secondary cataract, left eye: Secondary | ICD-10-CM | POA: Diagnosis not present

## 2015-12-26 DIAGNOSIS — Z1231 Encounter for screening mammogram for malignant neoplasm of breast: Secondary | ICD-10-CM | POA: Diagnosis not present

## 2016-01-16 DIAGNOSIS — R7989 Other specified abnormal findings of blood chemistry: Secondary | ICD-10-CM | POA: Diagnosis not present

## 2016-01-16 DIAGNOSIS — R251 Tremor, unspecified: Secondary | ICD-10-CM | POA: Diagnosis not present

## 2016-01-16 DIAGNOSIS — E1140 Type 2 diabetes mellitus with diabetic neuropathy, unspecified: Secondary | ICD-10-CM | POA: Diagnosis not present

## 2016-01-16 DIAGNOSIS — I2510 Atherosclerotic heart disease of native coronary artery without angina pectoris: Secondary | ICD-10-CM | POA: Diagnosis not present

## 2016-01-16 DIAGNOSIS — N183 Chronic kidney disease, stage 3 (moderate): Secondary | ICD-10-CM | POA: Diagnosis not present

## 2016-01-16 DIAGNOSIS — E7800 Pure hypercholesterolemia, unspecified: Secondary | ICD-10-CM | POA: Diagnosis not present

## 2016-01-16 DIAGNOSIS — I10 Essential (primary) hypertension: Secondary | ICD-10-CM | POA: Diagnosis not present

## 2016-01-16 DIAGNOSIS — R609 Edema, unspecified: Secondary | ICD-10-CM | POA: Diagnosis not present

## 2016-01-16 DIAGNOSIS — M159 Polyosteoarthritis, unspecified: Secondary | ICD-10-CM | POA: Diagnosis not present

## 2016-01-16 DIAGNOSIS — J309 Allergic rhinitis, unspecified: Secondary | ICD-10-CM | POA: Diagnosis not present

## 2016-01-16 DIAGNOSIS — E1121 Type 2 diabetes mellitus with diabetic nephropathy: Secondary | ICD-10-CM | POA: Diagnosis not present

## 2016-02-19 DIAGNOSIS — J309 Allergic rhinitis, unspecified: Secondary | ICD-10-CM | POA: Diagnosis not present

## 2016-02-19 DIAGNOSIS — I2510 Atherosclerotic heart disease of native coronary artery without angina pectoris: Secondary | ICD-10-CM | POA: Diagnosis not present

## 2016-02-19 DIAGNOSIS — E7800 Pure hypercholesterolemia, unspecified: Secondary | ICD-10-CM | POA: Diagnosis not present

## 2016-02-19 DIAGNOSIS — M159 Polyosteoarthritis, unspecified: Secondary | ICD-10-CM | POA: Diagnosis not present

## 2016-02-19 DIAGNOSIS — L309 Dermatitis, unspecified: Secondary | ICD-10-CM | POA: Diagnosis not present

## 2016-02-19 DIAGNOSIS — E1121 Type 2 diabetes mellitus with diabetic nephropathy: Secondary | ICD-10-CM | POA: Diagnosis not present

## 2016-02-19 DIAGNOSIS — R7989 Other specified abnormal findings of blood chemistry: Secondary | ICD-10-CM | POA: Diagnosis not present

## 2016-02-19 DIAGNOSIS — E1140 Type 2 diabetes mellitus with diabetic neuropathy, unspecified: Secondary | ICD-10-CM | POA: Diagnosis not present

## 2016-02-19 DIAGNOSIS — R251 Tremor, unspecified: Secondary | ICD-10-CM | POA: Diagnosis not present

## 2016-02-19 DIAGNOSIS — I10 Essential (primary) hypertension: Secondary | ICD-10-CM | POA: Diagnosis not present

## 2016-02-19 DIAGNOSIS — R609 Edema, unspecified: Secondary | ICD-10-CM | POA: Diagnosis not present

## 2016-02-19 DIAGNOSIS — N183 Chronic kidney disease, stage 3 (moderate): Secondary | ICD-10-CM | POA: Diagnosis not present

## 2016-02-28 DIAGNOSIS — S9305XA Dislocation of left ankle joint, initial encounter: Secondary | ICD-10-CM | POA: Diagnosis not present

## 2016-02-28 DIAGNOSIS — E1142 Type 2 diabetes mellitus with diabetic polyneuropathy: Secondary | ICD-10-CM | POA: Diagnosis not present

## 2016-02-28 DIAGNOSIS — E782 Mixed hyperlipidemia: Secondary | ICD-10-CM | POA: Diagnosis not present

## 2016-02-28 DIAGNOSIS — I10 Essential (primary) hypertension: Secondary | ICD-10-CM | POA: Diagnosis not present

## 2016-02-28 DIAGNOSIS — Z794 Long term (current) use of insulin: Secondary | ICD-10-CM | POA: Diagnosis not present

## 2016-02-28 DIAGNOSIS — R600 Localized edema: Secondary | ICD-10-CM | POA: Diagnosis not present

## 2016-03-04 DIAGNOSIS — S9305XA Dislocation of left ankle joint, initial encounter: Secondary | ICD-10-CM | POA: Diagnosis not present

## 2016-03-04 DIAGNOSIS — M79671 Pain in right foot: Secondary | ICD-10-CM | POA: Diagnosis not present

## 2016-03-04 DIAGNOSIS — M7731 Calcaneal spur, right foot: Secondary | ICD-10-CM | POA: Diagnosis not present

## 2016-03-04 DIAGNOSIS — E1142 Type 2 diabetes mellitus with diabetic polyneuropathy: Secondary | ICD-10-CM | POA: Diagnosis not present

## 2016-03-04 DIAGNOSIS — Z794 Long term (current) use of insulin: Secondary | ICD-10-CM | POA: Diagnosis not present

## 2016-03-04 DIAGNOSIS — M79672 Pain in left foot: Secondary | ICD-10-CM | POA: Diagnosis not present

## 2016-03-04 DIAGNOSIS — R600 Localized edema: Secondary | ICD-10-CM | POA: Diagnosis not present

## 2016-03-13 DIAGNOSIS — Z6841 Body Mass Index (BMI) 40.0 and over, adult: Secondary | ICD-10-CM | POA: Diagnosis not present

## 2016-03-13 DIAGNOSIS — Z794 Long term (current) use of insulin: Secondary | ICD-10-CM | POA: Diagnosis not present

## 2016-03-13 DIAGNOSIS — I129 Hypertensive chronic kidney disease with stage 1 through stage 4 chronic kidney disease, or unspecified chronic kidney disease: Secondary | ICD-10-CM | POA: Diagnosis not present

## 2016-03-13 DIAGNOSIS — R600 Localized edema: Secondary | ICD-10-CM | POA: Diagnosis not present

## 2016-03-13 DIAGNOSIS — E1142 Type 2 diabetes mellitus with diabetic polyneuropathy: Secondary | ICD-10-CM | POA: Diagnosis not present

## 2016-03-13 DIAGNOSIS — N183 Chronic kidney disease, stage 3 (moderate): Secondary | ICD-10-CM | POA: Diagnosis not present

## 2016-03-23 DIAGNOSIS — N183 Chronic kidney disease, stage 3 (moderate): Secondary | ICD-10-CM | POA: Diagnosis not present

## 2016-03-27 DIAGNOSIS — N959 Unspecified menopausal and perimenopausal disorder: Secondary | ICD-10-CM | POA: Diagnosis not present

## 2016-03-27 DIAGNOSIS — Z1211 Encounter for screening for malignant neoplasm of colon: Secondary | ICD-10-CM | POA: Diagnosis not present

## 2016-03-27 DIAGNOSIS — Z1231 Encounter for screening mammogram for malignant neoplasm of breast: Secondary | ICD-10-CM | POA: Diagnosis not present

## 2016-03-27 DIAGNOSIS — Z1389 Encounter for screening for other disorder: Secondary | ICD-10-CM | POA: Diagnosis not present

## 2016-03-27 DIAGNOSIS — M159 Polyosteoarthritis, unspecified: Secondary | ICD-10-CM | POA: Diagnosis not present

## 2016-03-27 DIAGNOSIS — E669 Obesity, unspecified: Secondary | ICD-10-CM | POA: Diagnosis not present

## 2016-03-27 DIAGNOSIS — Z23 Encounter for immunization: Secondary | ICD-10-CM | POA: Diagnosis not present

## 2016-03-27 DIAGNOSIS — R32 Unspecified urinary incontinence: Secondary | ICD-10-CM | POA: Diagnosis not present

## 2016-03-27 DIAGNOSIS — Z9181 History of falling: Secondary | ICD-10-CM | POA: Diagnosis not present

## 2016-03-27 DIAGNOSIS — Z136 Encounter for screening for cardiovascular disorders: Secondary | ICD-10-CM | POA: Diagnosis not present

## 2016-03-27 DIAGNOSIS — Z0000 Encounter for general adult medical examination without abnormal findings: Secondary | ICD-10-CM | POA: Diagnosis not present

## 2016-03-27 DIAGNOSIS — E785 Hyperlipidemia, unspecified: Secondary | ICD-10-CM | POA: Diagnosis not present

## 2016-04-01 DIAGNOSIS — N183 Chronic kidney disease, stage 3 (moderate): Secondary | ICD-10-CM | POA: Diagnosis not present

## 2016-04-01 DIAGNOSIS — R251 Tremor, unspecified: Secondary | ICD-10-CM | POA: Diagnosis not present

## 2016-04-01 DIAGNOSIS — R609 Edema, unspecified: Secondary | ICD-10-CM | POA: Diagnosis not present

## 2016-04-01 DIAGNOSIS — E7800 Pure hypercholesterolemia, unspecified: Secondary | ICD-10-CM | POA: Diagnosis not present

## 2016-04-01 DIAGNOSIS — J309 Allergic rhinitis, unspecified: Secondary | ICD-10-CM | POA: Diagnosis not present

## 2016-04-01 DIAGNOSIS — I10 Essential (primary) hypertension: Secondary | ICD-10-CM | POA: Diagnosis not present

## 2016-04-01 DIAGNOSIS — M159 Polyosteoarthritis, unspecified: Secondary | ICD-10-CM | POA: Diagnosis not present

## 2016-04-01 DIAGNOSIS — E1121 Type 2 diabetes mellitus with diabetic nephropathy: Secondary | ICD-10-CM | POA: Diagnosis not present

## 2016-04-01 DIAGNOSIS — I2510 Atherosclerotic heart disease of native coronary artery without angina pectoris: Secondary | ICD-10-CM | POA: Diagnosis not present

## 2016-04-01 DIAGNOSIS — E1140 Type 2 diabetes mellitus with diabetic neuropathy, unspecified: Secondary | ICD-10-CM | POA: Diagnosis not present

## 2016-04-01 DIAGNOSIS — R7989 Other specified abnormal findings of blood chemistry: Secondary | ICD-10-CM | POA: Diagnosis not present

## 2016-04-02 DIAGNOSIS — I129 Hypertensive chronic kidney disease with stage 1 through stage 4 chronic kidney disease, or unspecified chronic kidney disease: Secondary | ICD-10-CM | POA: Diagnosis not present

## 2016-04-02 DIAGNOSIS — N39490 Overflow incontinence: Secondary | ICD-10-CM | POA: Diagnosis not present

## 2016-04-02 DIAGNOSIS — E1142 Type 2 diabetes mellitus with diabetic polyneuropathy: Secondary | ICD-10-CM | POA: Diagnosis not present

## 2016-04-02 DIAGNOSIS — R600 Localized edema: Secondary | ICD-10-CM | POA: Diagnosis not present

## 2016-04-02 DIAGNOSIS — N183 Chronic kidney disease, stage 3 (moderate): Secondary | ICD-10-CM | POA: Diagnosis not present

## 2016-04-02 DIAGNOSIS — Z794 Long term (current) use of insulin: Secondary | ICD-10-CM | POA: Diagnosis not present

## 2016-04-25 DIAGNOSIS — E7800 Pure hypercholesterolemia, unspecified: Secondary | ICD-10-CM | POA: Diagnosis not present

## 2016-04-25 DIAGNOSIS — Z794 Long term (current) use of insulin: Secondary | ICD-10-CM | POA: Diagnosis not present

## 2016-04-25 DIAGNOSIS — E119 Type 2 diabetes mellitus without complications: Secondary | ICD-10-CM | POA: Diagnosis not present

## 2016-04-25 DIAGNOSIS — I10 Essential (primary) hypertension: Secondary | ICD-10-CM | POA: Diagnosis not present

## 2016-04-25 DIAGNOSIS — I2510 Atherosclerotic heart disease of native coronary artery without angina pectoris: Secondary | ICD-10-CM | POA: Diagnosis not present

## 2016-05-09 DIAGNOSIS — N183 Chronic kidney disease, stage 3 (moderate): Secondary | ICD-10-CM | POA: Diagnosis not present

## 2016-05-21 DIAGNOSIS — M159 Polyosteoarthritis, unspecified: Secondary | ICD-10-CM | POA: Diagnosis not present

## 2016-05-21 DIAGNOSIS — R7989 Other specified abnormal findings of blood chemistry: Secondary | ICD-10-CM | POA: Diagnosis not present

## 2016-05-21 DIAGNOSIS — I2510 Atherosclerotic heart disease of native coronary artery without angina pectoris: Secondary | ICD-10-CM | POA: Diagnosis not present

## 2016-05-21 DIAGNOSIS — J309 Allergic rhinitis, unspecified: Secondary | ICD-10-CM | POA: Diagnosis not present

## 2016-05-21 DIAGNOSIS — E1140 Type 2 diabetes mellitus with diabetic neuropathy, unspecified: Secondary | ICD-10-CM | POA: Diagnosis not present

## 2016-05-21 DIAGNOSIS — E1121 Type 2 diabetes mellitus with diabetic nephropathy: Secondary | ICD-10-CM | POA: Diagnosis not present

## 2016-05-21 DIAGNOSIS — R251 Tremor, unspecified: Secondary | ICD-10-CM | POA: Diagnosis not present

## 2016-05-21 DIAGNOSIS — I10 Essential (primary) hypertension: Secondary | ICD-10-CM | POA: Diagnosis not present

## 2016-05-21 DIAGNOSIS — E7800 Pure hypercholesterolemia, unspecified: Secondary | ICD-10-CM | POA: Diagnosis not present

## 2016-05-21 DIAGNOSIS — N183 Chronic kidney disease, stage 3 (moderate): Secondary | ICD-10-CM | POA: Diagnosis not present

## 2016-05-21 DIAGNOSIS — R609 Edema, unspecified: Secondary | ICD-10-CM | POA: Diagnosis not present

## 2016-05-30 DIAGNOSIS — E1142 Type 2 diabetes mellitus with diabetic polyneuropathy: Secondary | ICD-10-CM | POA: Diagnosis not present

## 2016-05-30 DIAGNOSIS — Z794 Long term (current) use of insulin: Secondary | ICD-10-CM | POA: Diagnosis not present

## 2016-05-30 DIAGNOSIS — N183 Chronic kidney disease, stage 3 (moderate): Secondary | ICD-10-CM | POA: Diagnosis not present

## 2016-05-30 DIAGNOSIS — I129 Hypertensive chronic kidney disease with stage 1 through stage 4 chronic kidney disease, or unspecified chronic kidney disease: Secondary | ICD-10-CM | POA: Diagnosis not present

## 2016-05-30 DIAGNOSIS — R600 Localized edema: Secondary | ICD-10-CM | POA: Diagnosis not present

## 2016-06-18 DIAGNOSIS — J309 Allergic rhinitis, unspecified: Secondary | ICD-10-CM | POA: Diagnosis not present

## 2016-06-18 DIAGNOSIS — E1121 Type 2 diabetes mellitus with diabetic nephropathy: Secondary | ICD-10-CM | POA: Diagnosis not present

## 2016-06-18 DIAGNOSIS — M159 Polyosteoarthritis, unspecified: Secondary | ICD-10-CM | POA: Diagnosis not present

## 2016-06-18 DIAGNOSIS — I2510 Atherosclerotic heart disease of native coronary artery without angina pectoris: Secondary | ICD-10-CM | POA: Diagnosis not present

## 2016-06-18 DIAGNOSIS — N183 Chronic kidney disease, stage 3 (moderate): Secondary | ICD-10-CM | POA: Diagnosis not present

## 2016-06-18 DIAGNOSIS — R7989 Other specified abnormal findings of blood chemistry: Secondary | ICD-10-CM | POA: Diagnosis not present

## 2016-06-18 DIAGNOSIS — E7800 Pure hypercholesterolemia, unspecified: Secondary | ICD-10-CM | POA: Diagnosis not present

## 2016-06-18 DIAGNOSIS — L0390 Cellulitis, unspecified: Secondary | ICD-10-CM | POA: Diagnosis not present

## 2016-06-18 DIAGNOSIS — I10 Essential (primary) hypertension: Secondary | ICD-10-CM | POA: Diagnosis not present

## 2016-06-18 DIAGNOSIS — R251 Tremor, unspecified: Secondary | ICD-10-CM | POA: Diagnosis not present

## 2016-06-18 DIAGNOSIS — E1140 Type 2 diabetes mellitus with diabetic neuropathy, unspecified: Secondary | ICD-10-CM | POA: Diagnosis not present

## 2016-06-18 DIAGNOSIS — R609 Edema, unspecified: Secondary | ICD-10-CM | POA: Diagnosis not present

## 2016-07-02 DIAGNOSIS — R251 Tremor, unspecified: Secondary | ICD-10-CM | POA: Diagnosis not present

## 2016-07-02 DIAGNOSIS — L309 Dermatitis, unspecified: Secondary | ICD-10-CM | POA: Diagnosis not present

## 2016-07-02 DIAGNOSIS — M159 Polyosteoarthritis, unspecified: Secondary | ICD-10-CM | POA: Diagnosis not present

## 2016-07-02 DIAGNOSIS — N183 Chronic kidney disease, stage 3 (moderate): Secondary | ICD-10-CM | POA: Diagnosis not present

## 2016-07-02 DIAGNOSIS — R7989 Other specified abnormal findings of blood chemistry: Secondary | ICD-10-CM | POA: Diagnosis not present

## 2016-07-02 DIAGNOSIS — R609 Edema, unspecified: Secondary | ICD-10-CM | POA: Diagnosis not present

## 2016-07-02 DIAGNOSIS — J309 Allergic rhinitis, unspecified: Secondary | ICD-10-CM | POA: Diagnosis not present

## 2016-07-02 DIAGNOSIS — E7800 Pure hypercholesterolemia, unspecified: Secondary | ICD-10-CM | POA: Diagnosis not present

## 2016-07-02 DIAGNOSIS — E1121 Type 2 diabetes mellitus with diabetic nephropathy: Secondary | ICD-10-CM | POA: Diagnosis not present

## 2016-07-02 DIAGNOSIS — I10 Essential (primary) hypertension: Secondary | ICD-10-CM | POA: Diagnosis not present

## 2016-07-02 DIAGNOSIS — E1140 Type 2 diabetes mellitus with diabetic neuropathy, unspecified: Secondary | ICD-10-CM | POA: Diagnosis not present

## 2016-07-02 DIAGNOSIS — I2510 Atherosclerotic heart disease of native coronary artery without angina pectoris: Secondary | ICD-10-CM | POA: Diagnosis not present

## 2016-07-16 DIAGNOSIS — R609 Edema, unspecified: Secondary | ICD-10-CM | POA: Diagnosis not present

## 2016-07-16 DIAGNOSIS — I2510 Atherosclerotic heart disease of native coronary artery without angina pectoris: Secondary | ICD-10-CM | POA: Diagnosis not present

## 2016-07-16 DIAGNOSIS — E1121 Type 2 diabetes mellitus with diabetic nephropathy: Secondary | ICD-10-CM | POA: Diagnosis not present

## 2016-07-16 DIAGNOSIS — J309 Allergic rhinitis, unspecified: Secondary | ICD-10-CM | POA: Diagnosis not present

## 2016-07-16 DIAGNOSIS — E7800 Pure hypercholesterolemia, unspecified: Secondary | ICD-10-CM | POA: Diagnosis not present

## 2016-07-16 DIAGNOSIS — E1140 Type 2 diabetes mellitus with diabetic neuropathy, unspecified: Secondary | ICD-10-CM | POA: Diagnosis not present

## 2016-07-16 DIAGNOSIS — I10 Essential (primary) hypertension: Secondary | ICD-10-CM | POA: Diagnosis not present

## 2016-07-16 DIAGNOSIS — R7989 Other specified abnormal findings of blood chemistry: Secondary | ICD-10-CM | POA: Diagnosis not present

## 2016-07-16 DIAGNOSIS — R251 Tremor, unspecified: Secondary | ICD-10-CM | POA: Diagnosis not present

## 2016-07-16 DIAGNOSIS — M159 Polyosteoarthritis, unspecified: Secondary | ICD-10-CM | POA: Diagnosis not present

## 2016-07-16 DIAGNOSIS — N183 Chronic kidney disease, stage 3 (moderate): Secondary | ICD-10-CM | POA: Diagnosis not present

## 2016-07-16 DIAGNOSIS — L03116 Cellulitis of left lower limb: Secondary | ICD-10-CM | POA: Diagnosis not present

## 2016-08-01 DIAGNOSIS — Z794 Long term (current) use of insulin: Secondary | ICD-10-CM | POA: Diagnosis not present

## 2016-08-01 DIAGNOSIS — E1122 Type 2 diabetes mellitus with diabetic chronic kidney disease: Secondary | ICD-10-CM | POA: Diagnosis not present

## 2016-08-01 DIAGNOSIS — I129 Hypertensive chronic kidney disease with stage 1 through stage 4 chronic kidney disease, or unspecified chronic kidney disease: Secondary | ICD-10-CM | POA: Diagnosis not present

## 2016-08-01 DIAGNOSIS — N183 Chronic kidney disease, stage 3 (moderate): Secondary | ICD-10-CM | POA: Diagnosis not present

## 2016-08-01 DIAGNOSIS — R600 Localized edema: Secondary | ICD-10-CM | POA: Diagnosis not present

## 2016-08-13 DIAGNOSIS — Z6841 Body Mass Index (BMI) 40.0 and over, adult: Secondary | ICD-10-CM | POA: Diagnosis not present

## 2016-08-13 DIAGNOSIS — I2510 Atherosclerotic heart disease of native coronary artery without angina pectoris: Secondary | ICD-10-CM | POA: Diagnosis not present

## 2016-08-13 DIAGNOSIS — R251 Tremor, unspecified: Secondary | ICD-10-CM | POA: Diagnosis not present

## 2016-08-13 DIAGNOSIS — E1121 Type 2 diabetes mellitus with diabetic nephropathy: Secondary | ICD-10-CM | POA: Diagnosis not present

## 2016-08-13 DIAGNOSIS — I10 Essential (primary) hypertension: Secondary | ICD-10-CM | POA: Diagnosis not present

## 2016-08-13 DIAGNOSIS — R945 Abnormal results of liver function studies: Secondary | ICD-10-CM | POA: Diagnosis not present

## 2016-08-13 DIAGNOSIS — M159 Polyosteoarthritis, unspecified: Secondary | ICD-10-CM | POA: Diagnosis not present

## 2016-08-13 DIAGNOSIS — N183 Chronic kidney disease, stage 3 (moderate): Secondary | ICD-10-CM | POA: Diagnosis not present

## 2016-08-13 DIAGNOSIS — R609 Edema, unspecified: Secondary | ICD-10-CM | POA: Diagnosis not present

## 2016-08-13 DIAGNOSIS — J309 Allergic rhinitis, unspecified: Secondary | ICD-10-CM | POA: Diagnosis not present

## 2016-08-13 DIAGNOSIS — E1140 Type 2 diabetes mellitus with diabetic neuropathy, unspecified: Secondary | ICD-10-CM | POA: Diagnosis not present

## 2016-08-13 DIAGNOSIS — E7800 Pure hypercholesterolemia, unspecified: Secondary | ICD-10-CM | POA: Diagnosis not present

## 2016-08-28 DIAGNOSIS — I10 Essential (primary) hypertension: Secondary | ICD-10-CM | POA: Diagnosis not present

## 2016-08-28 DIAGNOSIS — E782 Mixed hyperlipidemia: Secondary | ICD-10-CM | POA: Diagnosis not present

## 2016-08-28 DIAGNOSIS — E1142 Type 2 diabetes mellitus with diabetic polyneuropathy: Secondary | ICD-10-CM | POA: Diagnosis not present

## 2016-08-28 DIAGNOSIS — Z794 Long term (current) use of insulin: Secondary | ICD-10-CM | POA: Diagnosis not present

## 2016-08-28 DIAGNOSIS — R600 Localized edema: Secondary | ICD-10-CM | POA: Diagnosis not present

## 2016-08-28 DIAGNOSIS — I129 Hypertensive chronic kidney disease with stage 1 through stage 4 chronic kidney disease, or unspecified chronic kidney disease: Secondary | ICD-10-CM | POA: Diagnosis not present

## 2016-08-28 DIAGNOSIS — Z6841 Body Mass Index (BMI) 40.0 and over, adult: Secondary | ICD-10-CM | POA: Diagnosis not present

## 2016-08-28 DIAGNOSIS — N182 Chronic kidney disease, stage 2 (mild): Secondary | ICD-10-CM | POA: Diagnosis not present

## 2016-09-03 ENCOUNTER — Encounter (HOSPITAL_COMMUNITY): Payer: Self-pay

## 2016-09-03 ENCOUNTER — Emergency Department (HOSPITAL_COMMUNITY): Payer: Medicare Other

## 2016-09-03 ENCOUNTER — Inpatient Hospital Stay (HOSPITAL_COMMUNITY)
Admission: EM | Admit: 2016-09-03 | Discharge: 2016-09-07 | DRG: 553 | Disposition: A | Discharge status: Skilled Nursing Facility | Payer: Medicare Other | Attending: Internal Medicine | Admitting: Internal Medicine

## 2016-09-03 DIAGNOSIS — I2510 Atherosclerotic heart disease of native coronary artery without angina pectoris: Secondary | ICD-10-CM | POA: Diagnosis present

## 2016-09-03 DIAGNOSIS — S8990XA Unspecified injury of unspecified lower leg, initial encounter: Secondary | ICD-10-CM | POA: Diagnosis not present

## 2016-09-03 DIAGNOSIS — I10 Essential (primary) hypertension: Secondary | ICD-10-CM | POA: Diagnosis present

## 2016-09-03 DIAGNOSIS — E1151 Type 2 diabetes mellitus with diabetic peripheral angiopathy without gangrene: Secondary | ICD-10-CM | POA: Diagnosis present

## 2016-09-03 DIAGNOSIS — S0990XA Unspecified injury of head, initial encounter: Secondary | ICD-10-CM | POA: Diagnosis not present

## 2016-09-03 DIAGNOSIS — E1122 Type 2 diabetes mellitus with diabetic chronic kidney disease: Secondary | ICD-10-CM | POA: Diagnosis present

## 2016-09-03 DIAGNOSIS — E669 Obesity, unspecified: Secondary | ICD-10-CM | POA: Diagnosis present

## 2016-09-03 DIAGNOSIS — Z794 Long term (current) use of insulin: Secondary | ICD-10-CM | POA: Diagnosis not present

## 2016-09-03 DIAGNOSIS — Z6841 Body Mass Index (BMI) 40.0 and over, adult: Secondary | ICD-10-CM

## 2016-09-03 DIAGNOSIS — N390 Urinary tract infection, site not specified: Secondary | ICD-10-CM | POA: Diagnosis not present

## 2016-09-03 DIAGNOSIS — G8911 Acute pain due to trauma: Secondary | ICD-10-CM | POA: Diagnosis not present

## 2016-09-03 DIAGNOSIS — I509 Heart failure, unspecified: Secondary | ICD-10-CM | POA: Diagnosis not present

## 2016-09-03 DIAGNOSIS — R262 Difficulty in walking, not elsewhere classified: Secondary | ICD-10-CM | POA: Diagnosis not present

## 2016-09-03 DIAGNOSIS — D72829 Elevated white blood cell count, unspecified: Secondary | ICD-10-CM

## 2016-09-03 DIAGNOSIS — Z955 Presence of coronary angioplasty implant and graft: Secondary | ICD-10-CM | POA: Diagnosis not present

## 2016-09-03 DIAGNOSIS — W19XXXA Unspecified fall, initial encounter: Secondary | ICD-10-CM | POA: Diagnosis not present

## 2016-09-03 DIAGNOSIS — Z90710 Acquired absence of both cervix and uterus: Secondary | ICD-10-CM | POA: Diagnosis not present

## 2016-09-03 DIAGNOSIS — Z833 Family history of diabetes mellitus: Secondary | ICD-10-CM

## 2016-09-03 DIAGNOSIS — I5033 Acute on chronic diastolic (congestive) heart failure: Secondary | ICD-10-CM | POA: Diagnosis not present

## 2016-09-03 DIAGNOSIS — S0101XA Laceration without foreign body of scalp, initial encounter: Secondary | ICD-10-CM | POA: Diagnosis present

## 2016-09-03 DIAGNOSIS — M7989 Other specified soft tissue disorders: Secondary | ICD-10-CM | POA: Diagnosis not present

## 2016-09-03 DIAGNOSIS — Z880 Allergy status to penicillin: Secondary | ICD-10-CM | POA: Diagnosis not present

## 2016-09-03 DIAGNOSIS — S098XXA Other specified injuries of head, initial encounter: Secondary | ICD-10-CM | POA: Diagnosis not present

## 2016-09-03 DIAGNOSIS — Z886 Allergy status to analgesic agent status: Secondary | ICD-10-CM | POA: Diagnosis not present

## 2016-09-03 DIAGNOSIS — R627 Adult failure to thrive: Secondary | ICD-10-CM | POA: Diagnosis not present

## 2016-09-03 DIAGNOSIS — S199XXA Unspecified injury of neck, initial encounter: Secondary | ICD-10-CM | POA: Diagnosis not present

## 2016-09-03 DIAGNOSIS — M25462 Effusion, left knee: Secondary | ICD-10-CM | POA: Diagnosis not present

## 2016-09-03 DIAGNOSIS — N182 Chronic kidney disease, stage 2 (mild): Secondary | ICD-10-CM | POA: Diagnosis present

## 2016-09-03 DIAGNOSIS — E119 Type 2 diabetes mellitus without complications: Secondary | ICD-10-CM | POA: Diagnosis not present

## 2016-09-03 DIAGNOSIS — E785 Hyperlipidemia, unspecified: Secondary | ICD-10-CM | POA: Diagnosis present

## 2016-09-03 DIAGNOSIS — Z23 Encounter for immunization: Secondary | ICD-10-CM

## 2016-09-03 DIAGNOSIS — I130 Hypertensive heart and chronic kidney disease with heart failure and stage 1 through stage 4 chronic kidney disease, or unspecified chronic kidney disease: Secondary | ICD-10-CM | POA: Diagnosis present

## 2016-09-03 DIAGNOSIS — M1712 Unilateral primary osteoarthritis, left knee: Principal | ICD-10-CM | POA: Diagnosis present

## 2016-09-03 DIAGNOSIS — R41841 Cognitive communication deficit: Secondary | ICD-10-CM | POA: Diagnosis not present

## 2016-09-03 DIAGNOSIS — Z8673 Personal history of transient ischemic attack (TIA), and cerebral infarction without residual deficits: Secondary | ICD-10-CM

## 2016-09-03 DIAGNOSIS — R7611 Nonspecific reaction to tuberculin skin test without active tuberculosis: Secondary | ICD-10-CM | POA: Diagnosis not present

## 2016-09-03 DIAGNOSIS — S8992XA Unspecified injury of left lower leg, initial encounter: Secondary | ICD-10-CM

## 2016-09-03 DIAGNOSIS — E7800 Pure hypercholesterolemia, unspecified: Secondary | ICD-10-CM | POA: Diagnosis not present

## 2016-09-03 DIAGNOSIS — M6281 Muscle weakness (generalized): Secondary | ICD-10-CM | POA: Diagnosis not present

## 2016-09-03 DIAGNOSIS — S064X0A Epidural hemorrhage without loss of consciousness, initial encounter: Secondary | ICD-10-CM | POA: Diagnosis not present

## 2016-09-03 DIAGNOSIS — Z7984 Long term (current) use of oral hypoglycemic drugs: Secondary | ICD-10-CM | POA: Diagnosis not present

## 2016-09-03 DIAGNOSIS — Z9181 History of falling: Secondary | ICD-10-CM | POA: Diagnosis not present

## 2016-09-03 DIAGNOSIS — M25562 Pain in left knee: Secondary | ICD-10-CM | POA: Diagnosis not present

## 2016-09-03 DIAGNOSIS — S8991XA Unspecified injury of right lower leg, initial encounter: Secondary | ICD-10-CM | POA: Diagnosis not present

## 2016-09-03 DIAGNOSIS — M25561 Pain in right knee: Secondary | ICD-10-CM | POA: Diagnosis not present

## 2016-09-03 DIAGNOSIS — S299XXA Unspecified injury of thorax, initial encounter: Secondary | ICD-10-CM | POA: Diagnosis not present

## 2016-09-03 DIAGNOSIS — Z8249 Family history of ischemic heart disease and other diseases of the circulatory system: Secondary | ICD-10-CM | POA: Diagnosis not present

## 2016-09-03 DIAGNOSIS — R278 Other lack of coordination: Secondary | ICD-10-CM | POA: Diagnosis not present

## 2016-09-03 DIAGNOSIS — Z5189 Encounter for other specified aftercare: Secondary | ICD-10-CM | POA: Diagnosis not present

## 2016-09-03 DIAGNOSIS — R221 Localized swelling, mass and lump, neck: Secondary | ICD-10-CM | POA: Diagnosis not present

## 2016-09-03 DIAGNOSIS — R222 Localized swelling, mass and lump, trunk: Secondary | ICD-10-CM | POA: Diagnosis not present

## 2016-09-03 DIAGNOSIS — W19XXXD Unspecified fall, subsequent encounter: Secondary | ICD-10-CM | POA: Diagnosis not present

## 2016-09-03 HISTORY — DX: Chronic kidney disease, stage 2 (mild): N18.2

## 2016-09-03 HISTORY — DX: Unspecified osteoarthritis, unspecified site: M19.90

## 2016-09-03 HISTORY — DX: Type 2 diabetes mellitus without complications: E11.9

## 2016-09-03 HISTORY — DX: Essential (primary) hypertension: I10

## 2016-09-03 HISTORY — DX: Pure hypercholesterolemia, unspecified: E78.00

## 2016-09-03 HISTORY — DX: Overflow incontinence: N39.490

## 2016-09-03 HISTORY — DX: Obesity, unspecified: E66.9

## 2016-09-03 LAB — CBC WITH DIFFERENTIAL/PLATELET
BASOS PCT: 0 %
Basophils Absolute: 0 10*3/uL (ref 0.0–0.1)
EOS ABS: 0.1 10*3/uL (ref 0.0–0.7)
Eosinophils Relative: 1 %
HCT: 42.4 % (ref 36.0–46.0)
Hemoglobin: 12.8 g/dL (ref 12.0–15.0)
Lymphocytes Relative: 11 %
Lymphs Abs: 1.8 10*3/uL (ref 0.7–4.0)
MCH: 27.9 pg (ref 26.0–34.0)
MCHC: 30.2 g/dL (ref 30.0–36.0)
MCV: 92.4 fL (ref 78.0–100.0)
MONO ABS: 1.1 10*3/uL — AB (ref 0.1–1.0)
MONOS PCT: 7 %
NEUTROS ABS: 12.7 10*3/uL — AB (ref 1.7–7.7)
Neutrophils Relative %: 81 %
PLATELETS: 245 10*3/uL (ref 150–400)
RBC: 4.59 MIL/uL (ref 3.87–5.11)
RDW: 15.2 % (ref 11.5–15.5)
WBC: 15.7 10*3/uL — ABNORMAL HIGH (ref 4.0–10.5)

## 2016-09-03 LAB — COMPREHENSIVE METABOLIC PANEL
ALT: 22 U/L (ref 14–54)
AST: 22 U/L (ref 15–41)
Albumin: 3.5 g/dL (ref 3.5–5.0)
Alkaline Phosphatase: 86 U/L (ref 38–126)
Anion gap: 10 (ref 5–15)
BUN: 20 mg/dL (ref 6–20)
CALCIUM: 9.6 mg/dL (ref 8.9–10.3)
CHLORIDE: 103 mmol/L (ref 101–111)
CO2: 27 mmol/L (ref 22–32)
Creatinine, Ser: 0.92 mg/dL (ref 0.44–1.00)
GFR calc Af Amer: >60 mL/min (ref >60)
GFR calc non Af Amer: >60 mL/min (ref >60)
Glucose, Bld: 115 mg/dL — ABNORMAL HIGH (ref 65–99)
Potassium: 4.1 mmol/L (ref 3.5–5.1)
SODIUM: 140 mmol/L (ref 135–145)
Total Bilirubin: 0.6 mg/dL (ref 0.3–1.2)
Total Protein: 6.1 g/dL — ABNORMAL LOW (ref 6.5–8.1)

## 2016-09-03 LAB — I-STAT TROPONIN, ED: TROPONIN I, POC: 0.07 ng/mL (ref 0.00–0.08)

## 2016-09-03 LAB — BRAIN NATRIURETIC PEPTIDE: B Natriuretic Peptide: 177.4 pg/mL — ABNORMAL HIGH (ref 0.0–100.0)

## 2016-09-03 MED ORDER — HYDROCODONE-ACETAMINOPHEN 5-325 MG PO TABS
1.0000 | ORAL_TABLET | Freq: Four times a day (QID) | ORAL | Status: DC | PRN
  Administered 2016-09-04 – 2016-09-07 (×9): 1 via ORAL
  Filled 2016-09-03 (×10): qty 1

## 2016-09-03 MED ORDER — BASAGLAR KWIKPEN 100 UNIT/ML ~~LOC~~ SOPN: 20.0000 [IU] | PEN_INJECTOR | Freq: Two times a day (BID) | SUBCUTANEOUS | Status: DC

## 2016-09-03 MED ORDER — ONDANSETRON HCL 4 MG/2ML IJ SOLN: 4.0000 mg | Freq: Three times a day (TID) | INTRAMUSCULAR | Status: DC | PRN

## 2016-09-03 MED ORDER — TETANUS-DIPHTH-ACELL PERTUSSIS 5-2.5-18.5 LF-MCG/0.5 IM SUSP
0.5000 mL | Freq: Once | INTRAMUSCULAR | Status: AC
  Administered 2016-09-03: 0.5 mL via INTRAMUSCULAR
  Filled 2016-09-03: qty 0.5

## 2016-09-03 MED ORDER — LORATADINE 10 MG PO TABS
10.0000 mg | ORAL_TABLET | Freq: Every day | ORAL | Status: DC
  Administered 2016-09-04 – 2016-09-07 (×4): 10 mg via ORAL
  Filled 2016-09-03 (×4): qty 1

## 2016-09-03 MED ORDER — PRAVASTATIN SODIUM 40 MG PO TABS
40.0000 mg | ORAL_TABLET | Freq: Every day | ORAL | Status: DC
  Administered 2016-09-04 – 2016-09-07 (×4): 40 mg via ORAL
  Filled 2016-09-03 (×4): qty 1

## 2016-09-03 MED ORDER — CALCIUM CARBONATE 1250 (500 CA) MG PO TABS
2.0000 | ORAL_TABLET | Freq: Every day | ORAL | Status: DC
  Administered 2016-09-04 – 2016-09-07 (×4): 1000 mg via ORAL
  Filled 2016-09-03 (×2): qty 2
  Filled 2016-09-03 (×3): qty 1

## 2016-09-03 MED ORDER — INSULIN ASPART 100 UNIT/ML ~~LOC~~ SOLN
0.0000 [IU] | Freq: Three times a day (TID) | SUBCUTANEOUS | Status: DC
  Administered 2016-09-04: 3 [IU] via SUBCUTANEOUS
  Administered 2016-09-04: 5 [IU] via SUBCUTANEOUS
  Administered 2016-09-05: 1 [IU] via SUBCUTANEOUS
  Administered 2016-09-05: 2 [IU] via SUBCUTANEOUS
  Administered 2016-09-05: 5 [IU] via SUBCUTANEOUS
  Administered 2016-09-06: 2 [IU] via SUBCUTANEOUS
  Administered 2016-09-06: 5 [IU] via SUBCUTANEOUS
  Administered 2016-09-06: 2 [IU] via SUBCUTANEOUS
  Administered 2016-09-07: 3 [IU] via SUBCUTANEOUS

## 2016-09-03 MED ORDER — ASPIRIN EC 81 MG PO TBEC
81.0000 mg | DELAYED_RELEASE_TABLET | Freq: Every day | ORAL | Status: DC
  Administered 2016-09-04 – 2016-09-07 (×4): 81 mg via ORAL
  Filled 2016-09-03 (×4): qty 1

## 2016-09-03 MED ORDER — ACETAMINOPHEN 650 MG RE SUPP: 650.0000 mg | Freq: Four times a day (QID) | RECTAL | Status: DC | PRN

## 2016-09-03 MED ORDER — HYDRALAZINE HCL 20 MG/ML IJ SOLN
5.0000 mg | INTRAMUSCULAR | Status: DC | PRN
  Filled 2016-09-03: qty 1

## 2016-09-03 MED ORDER — FUROSEMIDE 20 MG PO TABS
20.0000 mg | ORAL_TABLET | Freq: Two times a day (BID) | ORAL | Status: DC
Start: 2016-09-04 — End: 2016-09-04
  Administered 2016-09-04 (×2): 20 mg via ORAL
  Filled 2016-09-03 (×2): qty 1

## 2016-09-03 MED ORDER — LISINOPRIL 20 MG PO TABS
20.0000 mg | ORAL_TABLET | Freq: Every day | ORAL | Status: DC
  Administered 2016-09-04 – 2016-09-07 (×4): 20 mg via ORAL
  Filled 2016-09-03 (×4): qty 1

## 2016-09-03 MED ORDER — MORPHINE SULFATE (PF) 4 MG/ML IV SOLN
4.0000 mg | Freq: Once | INTRAVENOUS | Status: AC
  Administered 2016-09-03: 4 mg via INTRAVENOUS
  Filled 2016-09-03: qty 1

## 2016-09-03 MED ORDER — ZOLPIDEM TARTRATE 5 MG PO TABS
5.0000 mg | ORAL_TABLET | Freq: Every evening | ORAL | Status: DC | PRN
  Administered 2016-09-04: 5 mg via ORAL
  Filled 2016-09-03: qty 1

## 2016-09-03 MED ORDER — SENNOSIDES-DOCUSATE SODIUM 8.6-50 MG PO TABS
1.0000 | ORAL_TABLET | Freq: Every evening | ORAL | Status: DC | PRN
  Filled 2016-09-03: qty 1

## 2016-09-03 MED ORDER — CLOPIDOGREL BISULFATE 75 MG PO TABS
75.0000 mg | ORAL_TABLET | Freq: Every day | ORAL | Status: DC
  Administered 2016-09-04 – 2016-09-07 (×4): 75 mg via ORAL
  Filled 2016-09-03 (×4): qty 1

## 2016-09-03 MED ORDER — SPIRONOLACTONE 25 MG PO TABS
25.0000 mg | ORAL_TABLET | Freq: Every day | ORAL | Status: DC
  Administered 2016-09-04 – 2016-09-07 (×4): 25 mg via ORAL
  Filled 2016-09-03 (×4): qty 1

## 2016-09-03 MED ORDER — CARVEDILOL 12.5 MG PO TABS
12.5000 mg | ORAL_TABLET | Freq: Two times a day (BID) | ORAL | Status: DC
  Administered 2016-09-04 – 2016-09-06 (×6): 12.5 mg via ORAL
  Filled 2016-09-03 (×7): qty 1

## 2016-09-03 MED ORDER — ACETAMINOPHEN 325 MG PO TABS
650.0000 mg | ORAL_TABLET | Freq: Four times a day (QID) | ORAL | Status: DC | PRN
Start: 2016-09-03 — End: 2016-09-07

## 2016-09-03 MED ORDER — GLUCOSAMINE CHONDR 500 COMPLEX PO CAPS: ORAL_CAPSULE | Freq: Every day | ORAL | Status: DC

## 2016-09-03 MED ORDER — ADULT MULTIVITAMIN W/MINERALS CH
1.0000 | ORAL_TABLET | Freq: Every day | ORAL | Status: DC
  Administered 2016-09-04 – 2016-09-07 (×4): 1 via ORAL
  Filled 2016-09-03 (×4): qty 1

## 2016-09-03 NOTE — ED Provider Notes (Addendum)
[facility]-EMERGENCY DEPT Provider Note   CSN: [id number] Arrival date & time: 09/03/16  1307     History   Chief Complaint Chief Complaint  Patient presents with  . Fall  . Head Laceration    HPI Savannah Hernandez is a 66 y.o. female.  HPI Patient presents to the emergency room for evaluation after fall. Patient states she went to her doctor's office morning and had to walk a long distance. She felt somewhat fatigued. When she got home she was walking in the kitchen and her knees gave out. Patient fell backwards hitting her head on the floor. This occurred around 10 AM. Patient sustained a laceration to back of her head. Patient was not able to get up on her own. She had to slide around the floor until she can get to her phone.  She is complaining of her head and her neck. She has some pain in her chest after hitting her ribs. She is also complaining of knee pain. She denies any vomiting or diarrhea. No focal numbness or weakness. She does have difficulty lifting her legs off the bed. She thinks this because of how swollen her legs are. Past Medical History:  Diagnosis Date  . Diabetes mellitus without complication (HCC)   . Hypercholesteremia   . Hypertension   . Incontinence overflow, urine   . Obesity   . Renal disorder    stage 2, GFR 60-89 ml/min    There are no active problems to display for this patient.   Past Surgical History:  Procedure Laterality Date  . ABDOMINAL HYSTERECTOMY    . heart stent      OB History    No data available       Home Medications    Prior to Admission medications   Not on File    Family History History reviewed. No pertinent family history.  Social History Social History  Substance Use Topics  . Smoking status: Never Smoker  . Smokeless tobacco: Never Used  . Alcohol use No     Allergies   Penicillins and Aspirin   Review of Systems Review of Systems  All other systems reviewed and are negative.    Physical  Exam Updated Vital Signs BP (!) 177/62   Pulse (!) 58   Temp 98.6 F (37 C) (Oral)   Resp 18   Ht 1.588 m (5' 2.5")   Wt 113.4 kg (250 lb)   SpO2 96%   BMI 45.00 kg/m   Physical Exam  Constitutional: No distress.  HENT:  Head: Normocephalic and atraumatic.  Right Ear: External ear normal.  Left Ear: External ear normal.  Eyes: Conjunctivae are normal. Right eye exhibits no discharge. Left eye exhibits no discharge. No scleral icterus.  Neck: Neck supple. No tracheal deviation present.  Cardiovascular: Normal rate, regular rhythm and intact distal pulses.   Pulmonary/Chest: Effort normal and breath sounds normal. No stridor. No respiratory distress. She has no wheezes. She has no rales.  Abdominal: Soft. Bowel sounds are normal. She exhibits no distension. There is no tenderness. There is no rebound and no guarding.  Musculoskeletal: She exhibits edema. She exhibits no tenderness.  Large amount of peripheral edema bilateral lower extremities, as palpation both knees without any deformity  Neurological: She is alert. No cranial nerve deficit (no facial droop, extraocular movements intact, no slurred speech) or sensory deficit. She exhibits normal muscle tone. She displays no seizure activity. Coordination normal.  Patient can move her feet but has difficulty  lifting either leg off the bed, no focal deficits upper extremities, generalized weakness  Skin: Skin is warm and dry. No rash noted.  Poor hygiene feet  Psychiatric: She has a normal mood and affect.  Nursing note and vitals reviewed.    ED Treatments / Results  Labs (all labs ordered are listed, but only abnormal results are displayed) Labs Reviewed  CBC WITH DIFFERENTIAL/PLATELET - Abnormal; Notable for the following:       Result Value   WBC 15.7 (*)    Neutro Abs 12.7 (*)    Monocytes Absolute 1.1 (*)    All other components within normal limits  COMPREHENSIVE METABOLIC PANEL - Abnormal; Notable for the following:     Glucose, Bld 115 (*)    Total Protein 6.1 (*)    All other components within normal limits  BRAIN NATRIURETIC PEPTIDE - Abnormal; Notable for the following:    B Natriuretic Peptide 177.4 (*)    All other components within normal limits  I-STAT TROPONIN, ED    EKG  EKG Interpretation  Date/Time:  Tuesday September 03 2016 13:47:36 EDT Ventricular Rate:  60 PR Interval:    QRS Duration: 121 QT Interval:  483 QTC Calculation: 483 R Axis:   -69 Text Interpretation:  Sinus rhythm Short PR interval Nonspecific IVCD with LAD Left ventricular hypertrophy No old tracing to compare Confirmed by Linwood Dibbles [phone]) on 09/03/2016 1:57:16 PM Also confirmed by Linwood Dibbles [phone]), editor Misty Stanley [phone])  on 09/03/2016 2:15:59 PM       Radiology Dg Chest 2 View  Result Date: 09/03/2016 CLINICAL DATA:  66 year old female status post fall backwards in kitchen today. Chest and bilateral knee pain and swelling. EXAM: CHEST  2 VIEW COMPARISON:  [facility] Chest radiographs 09/06/2011 FINDINGS: Seated upright AP and lateral views of the chest. Stable cardiomegaly and mediastinal contours. Stable lung volumes. Chronic increased pulmonary interstitial markings. No pneumothorax, pulmonary edema, pleural effusion or acute pulmonary opacity. No acute osseous abnormality identified. Negative visible bowel gas pattern. IMPRESSION: Stable cardiomegaly. No acute cardiopulmonary abnormality. Electronically Signed   By: Odessa Fleming M.D.   On: 09/03/2016 16:12   Dg Knee 2 Views Left  Result Date: 09/03/2016 CLINICAL DATA:  66 year old female status post fall backwards in kitchen today. Chest and bilateral knee pain and swelling. EXAM: LEFT KNEE - 1-2 VIEW COMPARISON:  None. FINDINGS: AP and cross-table lateral views. Chronic but progressed and severe medial compartment joint space loss. Chronic tricompartmental degenerative spurring. Small to moderate suprapatellar joint effusion suspected. New cortical  irregularity along the anterior aspect of a femoral condyles cross-table lateral view (arrow). Underlying chronic distal femoral degenerative spurring. The patella appears intact. No proximal tibia or fibula fracture identified. Calcified peripheral vascular disease. Generalized soft tissue stranding and swelling. IMPRESSION: 1. Difficult to exclude an acute fracture of the anterior left femoral condyle. Knee MRI without contrast would probably be most valuable at this point, but failing that a noncontrast knee CT could evaluate further. 2. Joint effusion. Progressed and severe medial compartment joint degeneration. 3. Nonspecific generalized right lower extremity soft tissue edema. 4. Calcified peripheral vascular disease. Electronically Signed   By: Odessa Fleming M.D.   On: 09/03/2016 16:16   Dg Knee 2 Views Right  Result Date: 09/03/2016 CLINICAL DATA:  66 year old female status post fall backwards in kitchen today. Chest and bilateral knee pain and swelling. EXAM: RIGHT KNEE - 1-2 VIEW COMPARISON:  Right knee 08/07/2011. FINDINGS: Possible small right knee joint effusion.  Lateral predominant joint space loss and subchondral sclerosis. The patella appears intact. No acute osseous abnormality identified. Calcified peripheral vascular disease. Generalized soft tissue swelling and stranding. IMPRESSION: 1. No acute osseous abnormality identified. Possible small joint effusion. 2. Generalized nonspecific right lower extremity soft tissue edema. Calcified peripheral vascular disease. Electronically Signed   By: Odessa Fleming M.D.   On: 09/03/2016 16:17   Ct Head Wo Contrast  Result Date: 09/03/2016 CLINICAL DATA:  Patient status post fall. No reported loss consciousness. EXAM: CT HEAD WITHOUT CONTRAST CT CERVICAL SPINE WITHOUT CONTRAST TECHNIQUE: Multidetector CT imaging of the head and cervical spine was performed following the standard protocol without intravenous contrast. Multiplanar CT image reconstructions of the  cervical spine were also generated. COMPARISON:  Brain CT 09/21/2006 FINDINGS: CT HEAD FINDINGS Brain: Ventricles and sulci are appropriate for patient's age. No evidence for acute cortically based infarct, intracranial hemorrhage, mass lesion or mass-effect. Unchanged hypodensities within the right cerebellar hemisphere and left basal ganglia location which are nonspecific and may represent dilated prevascular spaces or old lacunar infarcts. Vascular: Internal carotid arterial vascular calcifications. Skull: Intact. Sinuses/Orbits: Paranasal sinuses are well aerated. Left mastoid air cells are unremarkable. Under pneumatization of the right mastoid air cells. Other: Mild soft tissue swelling overlying the dorsal calvarium. CT CERVICAL SPINE FINDINGS Alignment: Normal. Skull base and vertebrae: No acute fracture. No primary bone lesion or focal pathologic process. Soft tissues and spinal canal: No prevertebral fluid or swelling. No visible canal hematoma. Disc levels: Degenerative disc disease most pronounced C6-7 with anterior endplate osteophytosis. No evidence for acute fracture. Upper chest: Unremarkable Other: None. IMPRESSION: No acute intracranial process. Mild soft tissue swelling overlying the dorsal calvarium. No acute cervical spine fracture.  Degenerative disc disease. Electronically Signed   By: Annia Belt M.D.   On: 09/03/2016 14:47   Ct Cervical Spine Wo Contrast  Result Date: 09/03/2016 CLINICAL DATA:  Patient status post fall. No reported loss consciousness. EXAM: CT HEAD WITHOUT CONTRAST CT CERVICAL SPINE WITHOUT CONTRAST TECHNIQUE: Multidetector CT imaging of the head and cervical spine was performed following the standard protocol without intravenous contrast. Multiplanar CT image reconstructions of the cervical spine were also generated. COMPARISON:  Brain CT 09/21/2006 FINDINGS: CT HEAD FINDINGS Brain: Ventricles and sulci are appropriate for patient's age. No evidence for acute cortically  based infarct, intracranial hemorrhage, mass lesion or mass-effect. Unchanged hypodensities within the right cerebellar hemisphere and left basal ganglia location which are nonspecific and may represent dilated prevascular spaces or old lacunar infarcts. Vascular: Internal carotid arterial vascular calcifications. Skull: Intact. Sinuses/Orbits: Paranasal sinuses are well aerated. Left mastoid air cells are unremarkable. Under pneumatization of the right mastoid air cells. Other: Mild soft tissue swelling overlying the dorsal calvarium. CT CERVICAL SPINE FINDINGS Alignment: Normal. Skull base and vertebrae: No acute fracture. No primary bone lesion or focal pathologic process. Soft tissues and spinal canal: No prevertebral fluid or swelling. No visible canal hematoma. Disc levels: Degenerative disc disease most pronounced C6-7 with anterior endplate osteophytosis. No evidence for acute fracture. Upper chest: Unremarkable Other: None. IMPRESSION: No acute intracranial process. Mild soft tissue swelling overlying the dorsal calvarium. No acute cervical spine fracture.  Degenerative disc disease. Electronically Signed   By: Annia Belt M.D.   On: 09/03/2016 14:47    Procedures .Marland KitchenLaceration Repair Date/Time: 09/03/2016 5:13 PM Performed by: Linwood Dibbles Authorized by: Linwood Dibbles   Consent:    Consent obtained:  Verbal   Consent given by:  Patient   Risks  discussed:  Infection, need for additional repair, pain, poor cosmetic result and poor wound healing   Alternatives discussed:  No treatment and delayed treatment Anesthesia (see MAR for exact dosages):    Anesthesia method:  None Laceration details:    Location:  Scalp   Scalp location:  Occipital   Length (cm):  1 Repair type:    Repair type:  Simple Treatment:    Area cleansed with:  Saline   Amount of cleaning:  Standard Skin repair:    Repair method:  Staples   Number of staples:  2 Approximation:    Approximation:  Close Post-procedure  details:    Dressing:  Open (no dressing)   (including critical care time)  Medications Ordered in ED Medications  Tdap (BOOSTRIX) injection 0.5 mL (not administered)  morphine 4 MG/ML injection 4 mg (not administered)     Initial Impression / Assessment and Plan / ED Course  I have reviewed the triage vital signs and the nursing notes.  Pertinent labs & imaging results that were available during my care of the patient were reviewed by me and considered in my medical decision making (see chart for details).  Clinical Course as of Sep 03 1636  Tue Sep 03, 2016  1635 Reviewed xray results with patient.  Possible fx left knee on top of her severe DJD.  Son is also asking about assisted living facility.  Pt has not tried to walk here yet.  [JK]    Clinical Course User Index [JK] Linwood DibblesKnapp, Janel Beane, MD    Patient presented to the emergency room after a fall. Sounds like the patient has been having worsening symptoms associated with osteoarthritis of her knees. Patient previously saw a sports medicine doctor. She has had injections into the knee joint but has not seen an orthopedic doctor to discuss knee replacement surgery. Patient had acute pain at home and has not been able to stand or walk since then. X-rays show severe degenerative joint disease but there is the possibility of a fracture in the left knee. MRI is recommended. We will go ahead and get an MRI of her knee. Family is also asking about assisted living. I'll consult with case management.  Case turned over to Dr Corlis LeakMacKuen  Final Clinical Impressions(s) / ED Diagnoses   Final diagnoses:  Fall, initial encounter  Osteoarthritis of left knee, unspecified osteoarthritis type       Linwood DibblesKnapp, Nika Yazzie, MD 09/03/16 1640    Linwood DibblesKnapp, Lariza Cothron, MD 09/03/16 [phone]

## 2016-09-03 NOTE — H&P (Signed)
History and Physical    Savannah HillockKatherine F Moga EAV:[id number] DOB: [birth year] DOA: 09/03/2016  Referring MD/NP/PA:   PCP: Simone CuriaLee, Keung, MD   Patient coming from:  The patient is coming from home.  At baseline, pt is independent for most of ADL.  Chief Complaint: fall and left knee injury  HPI: Savannah Hernandez is a 66 y.o. female with medical history significant of hypertension, hyperlipidemia, diabetes mellitus, stroke, morbid obesity, CAD, stent placement, who presents with fall and left knee injury.  Patient states that she has chronic bilateral knee pain. Pt states that she went to doctors office this morning and had to walk a long distance d/t no handicapp parking spaces. After back home, she was walking in kitchen, her knees gave out. She fell backwards on the floor, hitting head on floor.  Pt developed hematoma in the back of head, with bleeding. She developed severe pain in the left knee, which is constant, 10 out of 10 in severity, sharp, nonradiating. Patient does not have unilateral weakness numbness or tingling to extremities. Denies chest pain, SOB, cough. No nausea, vomiting, diarrhea or abdominal pain. No symptoms of UTI.  ED Course: pt was found to have WBC 15.7, negative troponin, electrolytes renal function okay, temperature normal, bradycardia, AND sexual 91-96% on room air. Chest x-ray negative. CT head at the CT of C-spine is negative for acute abnormalities. X-ray of her right knee is negative for acute bony abnormalities. Pt is admitted to med-surg bed as inpt.   MRI of the left knee showed 1. 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 Reactive marrow edema and subchondral degenerative cystic change is noted secondary to osteoarthritis. Probable small focus of bone marrow contusions of the anterior tibial epiphysis. 2. Inferior articular surfacing tear of the posterior horn. A 7 x 8 x 10 mm similar attenuating  mass posterior to posterior horn may reflect a displaced meniscal fragment possibly off the anterior horn. 3. Marked attenuation and edema of the ACL suspicious for a high-grade ACL sprain/ tear. 4. Diffuse periarticular subcutaneous soft tissue edema.  Review of Systems:   General: no fevers, chills, no changes in body weight, has fatigue HEENT: no blurry vision, hearing changes or sore throat Respiratory: no dyspnea, coughing, wheezing CV: no chest pain, no palpitations GI: no nausea, vomiting, abdominal pain, diarrhea, constipation GU: no dysuria, burning on urination, increased urinary frequency, hematuria  Ext: no leg edema Neuro: no unilateral weakness, numbness, or tingling, no vision change or hearing loss. Had fall.  Skin: no rash. Has hematoma in occipital area with skin tear (sutured up) MSK: has left knee pain. Heme: No easy bruising.  Travel history: No recent long distant travel.  Allergy:  Allergies  Allergen Reactions  . Penicillins Hives and Shortness Of Breath  . Aspirin Other (See Comments)    Nausea and feels like something is "eating at stomach".     Past Medical History:  Diagnosis Date  . Diabetes mellitus without complication (HCC)   . Hypercholesteremia   . Hypertension   . Incontinence overflow, urine   . Obesity   . Renal disorder    stage 2, GFR 60-89 ml/min    Past Surgical History:  Procedure Laterality Date  . ABDOMINAL HYSTERECTOMY    . heart stent      Social History:  reports that she has never smoked. She has never used smokeless tobacco. She reports that she does not drink alcohol or use drugs.  Family History:  Family  History  Problem Relation Age of Onset  . Heart attack Mother   . Congestive Heart Failure Mother   . Diabetes Mellitus II Mother      Prior to Admission medications   Not on File    Physical Exam: Vitals:   09/03/16 2300 09/03/16 2345 09/04/16 0115 09/04/16 0200  BP: (!) 159/55 (!) 142/39 (!) 126/47 (!)  157/45  Pulse: 65 72 68 66  Resp: 17 20    Temp:      TempSrc:      SpO2: 90% (!) 89% (!) 88% 91%  Weight:      Height:       General: Not in acute distress HEENT:       Eyes: PERRL, EOMI, no scleral icterus.       ENT: No discharge from the ears and nose, no pharynx injection, no tonsillar enlargement.        Neck: No JVD, no bruit, no mass felt. Heme: No neck lymph node enlargement. Cardiac: S1/S2, RRR, No murmurs, No gallops or rubs. Respiratory:  No rales, wheezing, rhonchi or rubs. GI: Soft, nondistended, nontender, no rebound pain, no organomegaly, BS present. GU: No hematuria Ext: No pitting leg edema bilaterally. 2+DP/PT pulse bilaterally. Musculoskeletal: has tenderness in left knee. Skin: No rashes. Has hematoma in the occipital area with skin tear (sutured up) Neuro: Alert, oriented X3, cranial nerves II-XII grossly intact, moves all extremities normally. Psych: Patient is not psychotic, no suicidal or hemocidal ideation.  Labs on Admission: I have personally reviewed following labs and imaging studies  CBC:  Recent Labs Lab 09/03/16 1440 09/04/16 0210  WBC 15.7* 14.8*  NEUTROABS 12.7*  --   HGB 12.8 11.9*  HCT 42.4 39.5  MCV 92.4 92.3  PLT 245 230   Basic Metabolic Panel:  Recent Labs Lab 09/03/16 1440 09/04/16 0210  NA 140 140  K 4.1 4.5  CL 103 103  CO2 27 26  GLUCOSE 115* 182*  BUN 20 21*  CREATININE 0.92 0.88  CALCIUM 9.6 9.1   GFR: Estimated Creatinine Clearance: 74.5 mL/min (by C-G formula based on SCr of 0.88 mg/dL). Liver Function Tests:  Recent Labs Lab 09/03/16 1440  AST 22  ALT 22  ALKPHOS 86  BILITOT 0.6  PROT 6.1*  ALBUMIN 3.5   No results for input(s): LIPASE, AMYLASE in the last 168 hours. No results for input(s): AMMONIA in the last 168 hours. Coagulation Profile: No results for input(s): INR, PROTIME in the last 168 hours. Cardiac Enzymes: No results for input(s): CKTOTAL, CKMB, CKMBINDEX, TROPONINI in the last 168  hours. BNP (last 3 results) No results for input(s): PROBNP in the last 8760 hours. HbA1C: No results for input(s): HGBA1C in the last 72 hours. CBG: No results for input(s): GLUCAP in the last 168 hours. Lipid Profile: No results for input(s): CHOL, HDL, LDLCALC, TRIG, CHOLHDL, LDLDIRECT in the last 72 hours. Thyroid Function Tests: No results for input(s): TSH, T4TOTAL, FREET4, T3FREE, THYROIDAB in the last 72 hours. Anemia Panel: No results for input(s): VITAMINB12, FOLATE, FERRITIN, TIBC, IRON, RETICCTPCT in the last 72 hours. Urine analysis:    Component Value Date/Time   COLORURINE YELLOW 07/01/2006 1034   APPEARANCEUR CLEAR 07/01/2006 1034   LABSPEC 1.028 07/01/2006 1034   PHURINE 7.0 07/01/2006 1034   GLUCOSEU >1000 (A) 07/01/2006 1034   HGBUR NEGATIVE 07/01/2006 1034   BILIRUBINUR NEGATIVE 07/01/2006 1034   KETONESUR NEGATIVE 07/01/2006 1034   PROTEINUR NEGATIVE 07/01/2006 1034   UROBILINOGEN 0.2 07/01/2006  1034   NITRITE NEGATIVE 07/01/2006 1034   LEUKOCYTESUR NEGATIVE 07/01/2006 1034   Sepsis Labs: @LABRCNTIP (procalcitonin:4,lacticidven:4) )No results found for this or any previous visit (from the past 240 hour(s)).   Radiological Exams on Admission: Dg Chest 2 View  Result Date: 09/03/2016 CLINICAL DATA:  66 year old female status post fall backwards in kitchen today. Chest and bilateral knee pain and swelling. EXAM: CHEST  2 VIEW COMPARISON:  [facility] Chest radiographs 09/06/2011 FINDINGS: Seated upright AP and lateral views of the chest. Stable cardiomegaly and mediastinal contours. Stable lung volumes. Chronic increased pulmonary interstitial markings. No pneumothorax, pulmonary edema, pleural effusion or acute pulmonary opacity. No acute osseous abnormality identified. Negative visible bowel gas pattern. IMPRESSION: Stable cardiomegaly. No acute cardiopulmonary abnormality. Electronically Signed   By: Odessa Fleming M.D.   On: 09/03/2016 16:12   Dg Knee 2 Views  Left  Result Date: 09/03/2016 CLINICAL DATA:  66 year old female status post fall backwards in kitchen today. Chest and bilateral knee pain and swelling. EXAM: LEFT KNEE - 1-2 VIEW COMPARISON:  None. FINDINGS: AP and cross-table lateral views. Chronic but progressed and severe medial compartment joint space loss. Chronic tricompartmental degenerative spurring. Small to moderate suprapatellar joint effusion suspected. New cortical irregularity along the anterior aspect of a femoral condyles cross-table lateral view (arrow). Underlying chronic distal femoral degenerative spurring. The patella appears intact. No proximal tibia or fibula fracture identified. Calcified peripheral vascular disease. Generalized soft tissue stranding and swelling. IMPRESSION: 1. Difficult to exclude an acute fracture of the anterior left femoral condyle. Knee MRI without contrast would probably be most valuable at this point, but failing that a noncontrast knee CT could evaluate further. 2. Joint effusion. Progressed and severe medial compartment joint degeneration. 3. Nonspecific generalized right lower extremity soft tissue edema. 4. Calcified peripheral vascular disease. Electronically Signed   By: Odessa Fleming M.D.   On: 09/03/2016 16:16   Dg Knee 2 Views Right  Result Date: 09/03/2016 CLINICAL DATA:  66 year old female status post fall backwards in kitchen today. Chest and bilateral knee pain and swelling. EXAM: RIGHT KNEE - 1-2 VIEW COMPARISON:  Right knee 08/07/2011. FINDINGS: Possible small right knee joint effusion. Lateral predominant joint space loss and subchondral sclerosis. The patella appears intact. No acute osseous abnormality identified. Calcified peripheral vascular disease. Generalized soft tissue swelling and stranding. IMPRESSION: 1. No acute osseous abnormality identified. Possible small joint effusion. 2. Generalized nonspecific right lower extremity soft tissue edema. Calcified peripheral vascular disease.  Electronically Signed   By: Odessa Fleming M.D.   On: 09/03/2016 16:17   Ct Head Wo Contrast  Result Date: 09/03/2016 CLINICAL DATA:  Patient status post fall. No reported loss consciousness. EXAM: CT HEAD WITHOUT CONTRAST CT CERVICAL SPINE WITHOUT CONTRAST TECHNIQUE: Multidetector CT imaging of the head and cervical spine was performed following the standard protocol without intravenous contrast. Multiplanar CT image reconstructions of the cervical spine were also generated. COMPARISON:  Brain CT 09/21/2006 FINDINGS: CT HEAD FINDINGS Brain: Ventricles and sulci are appropriate for patient's age. No evidence for acute cortically based infarct, intracranial hemorrhage, mass lesion or mass-effect. Unchanged hypodensities within the right cerebellar hemisphere and left basal ganglia location which are nonspecific and may represent dilated prevascular spaces or old lacunar infarcts. Vascular: Internal carotid arterial vascular calcifications. Skull: Intact. Sinuses/Orbits: Paranasal sinuses are well aerated. Left mastoid air cells are unremarkable. Under pneumatization of the right mastoid air cells. Other: Mild soft tissue swelling overlying the dorsal calvarium. CT CERVICAL SPINE FINDINGS Alignment: Normal. Skull base  and vertebrae: No acute fracture. No primary bone lesion or focal pathologic process. Soft tissues and spinal canal: No prevertebral fluid or swelling. No visible canal hematoma. Disc levels: Degenerative disc disease most pronounced C6-7 with anterior endplate osteophytosis. No evidence for acute fracture. Upper chest: Unremarkable Other: None. IMPRESSION: No acute intracranial process. Mild soft tissue swelling overlying the dorsal calvarium. No acute cervical spine fracture.  Degenerative disc disease. Electronically Signed   By: Annia Belt M.D.   On: 09/03/2016 14:47   Ct Cervical Spine Wo Contrast  Result Date: 09/03/2016 CLINICAL DATA:  Patient status post fall. No reported loss consciousness.  EXAM: CT HEAD WITHOUT CONTRAST CT CERVICAL SPINE WITHOUT CONTRAST TECHNIQUE: Multidetector CT imaging of the head and cervical spine was performed following the standard protocol without intravenous contrast. Multiplanar CT image reconstructions of the cervical spine were also generated. COMPARISON:  Brain CT 09/21/2006 FINDINGS: CT HEAD FINDINGS Brain: Ventricles and sulci are appropriate for patient's age. No evidence for acute cortically based infarct, intracranial hemorrhage, mass lesion or mass-effect. Unchanged hypodensities within the right cerebellar hemisphere and left basal ganglia location which are nonspecific and may represent dilated prevascular spaces or old lacunar infarcts. Vascular: Internal carotid arterial vascular calcifications. Skull: Intact. Sinuses/Orbits: Paranasal sinuses are well aerated. Left mastoid air cells are unremarkable. Under pneumatization of the right mastoid air cells. Other: Mild soft tissue swelling overlying the dorsal calvarium. CT CERVICAL SPINE FINDINGS Alignment: Normal. Skull base and vertebrae: No acute fracture. No primary bone lesion or focal pathologic process. Soft tissues and spinal canal: No prevertebral fluid or swelling. No visible canal hematoma. Disc levels: Degenerative disc disease most pronounced C6-7 with anterior endplate osteophytosis. No evidence for acute fracture. Upper chest: Unremarkable Other: None. IMPRESSION: No acute intracranial process. Mild soft tissue swelling overlying the dorsal calvarium. No acute cervical spine fracture.  Degenerative disc disease. Electronically Signed   By: Annia Belt M.D.   On: 09/03/2016 14:47   Mr Knee Left Wo Contrast  Result Date: 09/03/2016 CLINICAL DATA:  Left knee pain after fall today. EXAM: MRI OF THE LEFT KNEE WITHOUT CONTRAST TECHNIQUE: Multiplanar, multisequence MR imaging of the knee was performed. No intravenous contrast was administered. COMPARISON:  None. FINDINGS: Limited by motion artifacts.  Best images obtained were provided for interpretation. MENISCI Medial meniscus: Inferior articular surfacing tear of the posterior horn of the medial meniscus. There is a 7 x 8 x 10 mm hypointense mass just posterior to the posterior horn and a displaced meniscal fragment possibly off the anterior horn given its absent appearance is a possibility for this finding. The remainder of the anterior horn is subluxed anteriorly. Lateral meniscus: Macerated appearance of the anterior horn of the lateral meniscus. LIGAMENTS Cruciates: Indistinct appearance of the ACL with a few intact appearing fibers raise concern for a high-grade ACL sprain. The PCL appears intact. Collaterals: Medial collateral ligament is intact. Lateral collateral ligament complex is intact. CARTILAGE Patellofemoral: Irregular cartilaginous thinning and fraying of the medial patellar facet cartilage with more homogeneous marked chondral loss down to bone involving the lateral patellar cartilage. Subchondral degenerative cystic changes are seen deep to the tibial eminence and lateral patellar facet. Fraying of the trochlear cartilage is also noted. Medial: Marked medial femorotibial chondromalacia with near bone-on-bone appearance. Lateral: Marked chondromalacia of the tibial plateau cartilage of the lateral femorotibial compartment with degenerative subchondral edema and early cystic change noted of the tibial plateau and weight-bearing portion of the lateral femoral condyle. Joint:  Moderate size suprapatellar  joint effusion with thin plica. Popliteal Fossa: Large popliteal cyst measuring 5.4 x 1.9 x 2.7 cm. Intact popliteus. Extensor Mechanism:  Intact quadriceps tendon and patellar tendon. Bones: Tricompartmental osteoarthritis with reactive marrow edema as above described. No acute fracture identified allowing for patient motion. There does appear to be some anterior tibial epiphyseal bone marrow edema/contusion. Other: Moderate degree of diffuse  soft tissue edema about the knee. IMPRESSION: 1. 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 Reactive marrow edema and subchondral degenerative cystic change is noted secondary to osteoarthritis. Probable small focus of bone marrow contusions of the anterior tibial epiphysis. 2. Inferior articular surfacing tear of the posterior horn. A 7 x 8 x 10 mm similar attenuating mass posterior to posterior horn may reflect a displaced meniscal fragment possibly off the anterior horn. 3. Marked attenuation and edema of the ACL suspicious for a high-grade ACL sprain/ tear. 4. Diffuse periarticular subcutaneous soft tissue edema. Electronically Signed   By: Tollie Eth M.D.   On: 09/03/2016 19:22     EKG: Independently reviewed.  Sinus rhythm, QTC 483, LAD  Assessment/Plan Principal Problem:   Fall Active Problems:   Hypertension   Hypercholesteremia   Diabetes mellitus without complication (HCC)   CAD (coronary artery disease)   Left knee injury   Leukocytosis   Fall and left knee injury: Orthopedic surgeon, Dr. Roda Shutters was consulted by EDP-->no need for surgery per Dr. Roda Shutters. Pt lives alone at home, cannot take care of herself. Patient may need rehabilitation placement.  -will admit to MedSurg bed as inpatient. -PT/OT -Consult to social work and Sports coach for possible rehabilitation placement. -apply L knee immobilizer -When necessary Norco for pain  HTN:  -IV hydralazine when necessary -Continue Coreg, Lasix, spironolactone  HLD: -Pravastatin  Diabetes mellitus without complication: Last A1c not on record. Patient is taking  glargine insulin, NovoLog, metformin at home -will decrease glargine insulin dose from 35-20 units twice a day  -SSI  CAD: s/p of stent. No CP. -continue aspirin, pravastatin and Coreg  Leukocytosis: no signs of infection. Likely due to stress induced to demargination. Chest x-ray negative. -will get UA and  Ux -follow up by CBC   DVT ppx: SCD Code Status: Full code Family Communication: None at bed side.  Disposition Plan:  Anticipate discharge back to previous rehabilitation facility Consults called:  none Admission status:  medical floor/obs      Date of Service 09/04/2016    Lorretta Harp [facility] Pager [phone]  If 7PM-7AM, please contact night-coverage [email or web address] Password [email or web address] 09/04/2016, 3:59 AM

## 2016-09-03 NOTE — ED Notes (Signed)
Ortho tech at bedside, pt is not able to tolerate knee immobilizer d/t pain.  Pt reports she is not able to ambulate d/t knees.

## 2016-09-03 NOTE — ED Triage Notes (Signed)
To room via EMS.  Pt went to doctors office this morning and had to walk a long distance d/t no handicapp parking spaces.  Pt got home and was walking in kitchen, knees gave out and pt fell backwards hitting head on floor.  Pt had on hair clip, hematoma noted to back of head and bleeding.

## 2016-09-04 ENCOUNTER — Encounter (HOSPITAL_COMMUNITY): Payer: Self-pay | Admitting: Internal Medicine

## 2016-09-04 ENCOUNTER — Encounter: Payer: Self-pay | Admitting: *Deleted

## 2016-09-04 DIAGNOSIS — N390 Urinary tract infection, site not specified: Secondary | ICD-10-CM

## 2016-09-04 DIAGNOSIS — R627 Adult failure to thrive: Secondary | ICD-10-CM

## 2016-09-04 DIAGNOSIS — M1712 Unilateral primary osteoarthritis, left knee: Principal | ICD-10-CM

## 2016-09-04 DIAGNOSIS — Z794 Long term (current) use of insulin: Secondary | ICD-10-CM

## 2016-09-04 DIAGNOSIS — I509 Heart failure, unspecified: Secondary | ICD-10-CM

## 2016-09-04 DIAGNOSIS — W19XXXA Unspecified fall, initial encounter: Secondary | ICD-10-CM

## 2016-09-04 DIAGNOSIS — E119 Type 2 diabetes mellitus without complications: Secondary | ICD-10-CM

## 2016-09-04 LAB — CBC
HCT: 39.5 % (ref 36.0–46.0)
HEMOGLOBIN: 11.9 g/dL — AB (ref 12.0–15.0)
MCH: 27.8 pg (ref 26.0–34.0)
MCHC: 30.1 g/dL (ref 30.0–36.0)
MCV: 92.3 fL (ref 78.0–100.0)
Platelets: 230 10*3/uL (ref 150–400)
RBC: 4.28 MIL/uL (ref 3.87–5.11)
RDW: 15.4 % (ref 11.5–15.5)
WBC: 14.8 10*3/uL — ABNORMAL HIGH (ref 4.0–10.5)

## 2016-09-04 LAB — URINALYSIS, ROUTINE W REFLEX MICROSCOPIC
Bilirubin Urine: NEGATIVE
Glucose, UA: >=500 mg/dL — AB
Ketones, ur: NEGATIVE mg/dL
Nitrite: POSITIVE — AB
PROTEIN: 30 mg/dL — AB
SQUAMOUS EPITHELIAL / LPF: NONE SEEN
Specific Gravity, Urine: 1.018 (ref 1.005–1.030)
pH: 6 (ref 5.0–8.0)

## 2016-09-04 LAB — BASIC METABOLIC PANEL
ANION GAP: 11 (ref 5–15)
BUN: 21 mg/dL — ABNORMAL HIGH (ref 6–20)
CALCIUM: 9.1 mg/dL (ref 8.9–10.3)
CO2: 26 mmol/L (ref 22–32)
Chloride: 103 mmol/L (ref 101–111)
Creatinine, Ser: 0.88 mg/dL (ref 0.44–1.00)
GFR calc Af Amer: >60 mL/min (ref >60)
GFR calc non Af Amer: >60 mL/min (ref >60)
GLUCOSE: 182 mg/dL — AB (ref 65–99)
Potassium: 4.5 mmol/L (ref 3.5–5.1)
Sodium: 140 mmol/L (ref 135–145)

## 2016-09-04 LAB — GLUCOSE, CAPILLARY
GLUCOSE-CAPILLARY: 227 mg/dL — AB (ref 65–99)
Glucose-Capillary: 260 mg/dL — ABNORMAL HIGH (ref 65–99)

## 2016-09-04 LAB — CBG MONITORING, ED
GLUCOSE-CAPILLARY: 154 mg/dL — AB (ref 65–99)
GLUCOSE-CAPILLARY: 291 mg/dL — AB (ref 65–99)

## 2016-09-04 MED ORDER — INSULIN GLARGINE 100 UNIT/ML ~~LOC~~ SOLN
20.0000 [IU] | Freq: Two times a day (BID) | SUBCUTANEOUS | Status: DC
  Administered 2016-09-04 – 2016-09-07 (×7): 20 [IU] via SUBCUTANEOUS
  Filled 2016-09-04 (×10): qty 0.2

## 2016-09-04 MED ORDER — BACITRACIN-NEOMYCIN-POLYMYXIN OINTMENT TUBE
TOPICAL_OINTMENT | Freq: Every day | CUTANEOUS | Status: DC
  Administered 2016-09-04 – 2016-09-07 (×4): via TOPICAL
  Filled 2016-09-04: qty 14.17

## 2016-09-04 MED ORDER — DEXTROSE 5 % IV SOLN
1.0000 g | Freq: Three times a day (TID) | INTRAVENOUS | Status: DC
  Administered 2016-09-04 – 2016-09-06 (×6): 1 g via INTRAVENOUS
  Filled 2016-09-04 (×9): qty 1

## 2016-09-04 MED ORDER — FUROSEMIDE 10 MG/ML IJ SOLN
40.0000 mg | Freq: Two times a day (BID) | INTRAMUSCULAR | Status: DC
  Filled 2016-09-04: qty 4

## 2016-09-04 MED ORDER — FUROSEMIDE 10 MG/ML IJ SOLN
40.0000 mg | Freq: Two times a day (BID) | INTRAMUSCULAR | Status: DC
  Administered 2016-09-04 – 2016-09-07 (×6): 40 mg via INTRAVENOUS
  Filled 2016-09-04 (×5): qty 4

## 2016-09-04 NOTE — Progress Notes (Signed)
Pharmacy Antibiotic Note  Savannah HillockKatherine F Hernandez is a 66 y.o. female admitted on 09/03/2016 with UTI.  Pharmacy has been consulted for aztreonam dosing.  Has PCN allergy causing SOB and hives. Afebrile, WBC 14.8.  Plan: Start aztreonam 1g IV Q8h Monitor clinical picture, renal function F/U C&S, abx deescalation / LOT   Height: 5' 2.5" (158.8 cm) Weight: 249 lb 4.8 oz (113.1 kg) IBW/kg (Calculated) : 51.25  Temp (24hrs), Avg:98.8 F (37.1 C), Min:98.3 F (36.8 C), Max:99.3 F (37.4 C)   Recent Labs Lab 09/03/16 1440 09/04/16 0210  WBC 15.7* 14.8*  CREATININE 0.92 0.88    Estimated Creatinine Clearance: 74.4 mL/min (by C-G formula based on SCr of 0.88 mg/dL).    Allergies  Allergen Reactions  . Penicillins Hives and Shortness Of Breath  . Aspirin Other (See Comments)    Nausea and feels like something is "eating at stomach".     Antimicrobials this admission: Aztreonam 8/8 >>   Dose adjustments this admission: n/a  Microbiology results: 8/8 UCx: sent   Thank you for allowing pharmacy to be a part of this patient's care.  Enzo BiNathan Nikia Levels, PharmD, BCPS Clinical Pharmacist Pager [phone] 09/04/2016 7:55 PM

## 2016-09-04 NOTE — Progress Notes (Signed)
Savannah HillockKatherine F Hernandez [id number] Admission Data: 09/04/2016 5:37 PM Attending Provider: Albertine GratesXu, Fang, MD  PCP:Lee, Marquette SaaKeung, MD Consults/ Treatment Team:   Savannah HillockKatherine F Carline is a 66 y.o. female patient admitted from ED awake, alert  & orientated  X 3,  Full Code, VSS - Blood pressure (!) 120/59, pulse 69, temperature 99.3 F (37.4 C), temperature source Oral, resp. rate 18, height 5' 2.5" (1.588 m), weight 113.4 kg (250 lb), SpO2 92 %., O2    2 L nasal cannular, no c/o shortness of breath, no c/o chest pain, no distress noted.    IV site WDL:  wrist left, condition patent and no redness with a transparent dsg that's clean dry and intact.  Allergies:   Allergies  Allergen Reactions  . Penicillins Hives and Shortness Of Breath  . Aspirin Other (See Comments)    Nausea and feels like something is "eating at stomach".      Past Medical History:  Diagnosis Date  . Diabetes mellitus without complication (HCC)   . Hypercholesteremia   . Hypertension   . Incontinence overflow, urine   . Obesity   . Renal disorder    stage 2, GFR 60-89 ml/min    History:  obtained from the patient.  Pt orientation to unit, room and routine. Information packet given to patient/family.  Admission INP armband ID verified with patient/family, and in place. SR up x 2, fall risk assessment complete with Patient and family verbalizing understanding of risks associated with falls. Pt verbalizes an understanding of how to use the call bell and to call for help before getting out of bed.  Skin has some moisture associated issues on her bottom. Blisters outlining the shape of pad the patient wears at home.  Has laceration on back of head from fall. Also some blisters on LE filled with serous fluid.  Will cont to monitor and assist as needed.  Laraine Samet Consuella Loselaine, RN 09/04/2016 5:37 PM

## 2016-09-04 NOTE — Progress Notes (Signed)
EDCM noted CM consult.  Reviewed chart and basis for consult are unclear as the CM consult order is incomplete and the EDP discharge note does not mention CM instructions.    

## 2016-09-04 NOTE — Progress Notes (Signed)
PROGRESS NOTE  AKACIA BOLTZ ZOX:[id number] DOB: 08-01-1950 DOA: 09/03/2016 PCP: Simone Curia, MD  HPI/Recap of past 24 hours:  She report bilateral knee pain not able to ambulate, she reported feeling sob, oxygen supplement helps, She report progressive bilateral lower extremity edema She denies chest pain, no fever,  Son at bedside  Assessment/Plan: Principal Problem:   Fall Active Problems:   Hypertension   Hypercholesteremia   Diabetes mellitus without complication (HCC)   CAD (coronary artery disease)   Left knee injury   Leukocytosis  Fall and left knee injury:  Orthopedic surgeon, Dr. Jed Limerick was consulted by EDP-->no need for surgery per ortho.  Pt lives alone at home, cannot take care of herself. Patient may need rehabilitation placement. L knee immobilizer, Pain control, PT/OT  CHF exacerbation: no prior echo available, she presented with sob and worsening of bilateral lower extremity edema despite home diuretics will start iv lasix, continue spironolactone, lisinopril, will get echo  H/o CAD: s/p of stent. No CP. -continue aspirin, plavix, pravastatin and Coreg  Insulin dependent Diabetes mellitus without complication:  Last A1c not on record. Patient is taking  glargine insulin, NovoLog, metformin at home -will decrease glargine insulin dose from 35-20 units twice a day , hold metformin recheck a1c -SSI   Leukocytosis:  Chest x-ray negative. ua + bacteria, WBC TNTC, + nitrite -she report weakness, denies current urinary symptom -urine culture pending, will start empirica abx aztreonam ( due to h/o pcn allergy) due to h/o diabetes   HTN:  -Continue Coreg, Lasix, spironolactone  HLD: -Pravastatin   Morbid obesity: Body mass index is 44.87 kg/m.  Code Status: full  Family Communication: patient and son at bedside  Disposition Plan: likely will need SNF   Consultants:  Orthopedics Dr Jed Limerick  Procedures:  none  Antibiotics:  none   Objective: BP (!) 120/59 (BP Location: Left Arm)   Pulse 69   Temp 99.3 F (37.4 C) (Oral)   Resp 18   Ht 5' 2.5" (1.588 m)   Wt 113.1 kg (249 lb 4.8 oz)   SpO2 92%   BMI 44.87 kg/m   Intake/Output Summary (Last 24 hours) at 09/04/16 1941 Last data filed at 09/04/16 1928  Gross per 24 hour  Intake              220 ml  Output                0 ml  Net              220 ml   Filed Weights   09/03/16 1339 09/04/16 1632  Weight: 113.4 kg (250 lb) 113.1 kg (249 lb 4.8 oz)    Exam: Patient is examined daily including today on 09/04/2016, exam remain the same as of yesterday except that is highlighted below   General:  Obese, frail, chronically ill appearing, NAD  Cardiovascular: RRR  Respiratory: diminished at basis, no rales, no wheezing,no rhonchi  Abdomen: Soft/ND/NT, positive BS  Musculoskeletal: bilateral pitting Edema up to knees, tenderness left knee  Neuro: aaox3, hematoma in occipital area with suture   Data Reviewed: Basic Metabolic Panel:  Recent Labs Lab 09/03/16 1440 09/04/16 0210  NA 140 140  K 4.1 4.5  CL 103 103  CO2 27 26  GLUCOSE 115* 182*  BUN 20 21*  CREATININE 0.92 0.88  CALCIUM 9.6 9.1   Liver Function Tests:  Recent Labs Lab 09/03/16 1440  AST 22  ALT 22  ALKPHOS 86  BILITOT 0.6  PROT 6.1*  ALBUMIN 3.5   No results for input(s): LIPASE, AMYLASE in the last 168 hours. No results for input(s): AMMONIA in the last 168 hours. CBC:  Recent Labs Lab 09/03/16 1440 09/04/16 0210  WBC 15.7* 14.8*  NEUTROABS 12.7*  --   HGB 12.8 11.9*  HCT 42.4 39.5  MCV 92.4 92.3  PLT 245 230   Cardiac Enzymes:   No results for input(s): CKTOTAL, CKMB, CKMBINDEX, TROPONINI in the last 168 hours. BNP (last 3 results)  Recent Labs  09/03/16 1440  BNP 177.4*    ProBNP (last 3 results) No results for input(s): PROBNP in the last 8760 hours.  CBG:  Recent Labs Lab 09/04/16 0909  09/04/16 1206 09/04/16 1714  GLUCAP 154* 291* 227*    No results found for this or any previous visit (from the past 240 hour(s)).   Studies: No results found.  Scheduled Meds: . aspirin EC  81 mg Oral Daily  . calcium carbonate  2 tablet Oral Q breakfast  . carvedilol  12.5 mg Oral BID WC  . clopidogrel  75 mg Oral Daily  . furosemide  40 mg Intravenous BID  . insulin aspart  0-9 Units Subcutaneous TID WC  . insulin glargine  20 Units Subcutaneous BID  . lisinopril  20 mg Oral Daily  . loratadine  10 mg Oral Daily  . multivitamin with minerals  1 tablet Oral Daily  . neomycin-bacitracin-polymyxin   Topical Daily  . pravastatin  40 mg Oral Daily  . spironolactone  25 mg Oral Daily    Continuous Infusions:   Time spent: 35mins I have personally reviewed and interpreted daily labs, tele strips, imagings as discussed above under date review session and assessment and plans.  Kristen Fromm MD, PhD  [facility] Pager [phone]. If 7PM-7AM, please contact night-coverage at [email or web address], password [email or web address] 09/04/2016, 7:41 PM  LOS: 1 day

## 2016-09-04 NOTE — Discharge Planning (Signed)
Previous two entries in error.

## 2016-09-04 NOTE — Discharge Planning (Signed)
EDCM noted CM consult.  Reviewed chart and basis for consult are unclear as the CM consult order is incomplete and the EDP discharge note does not mention CM instructions.    

## 2016-09-04 NOTE — Care Management (Signed)
ENCOUNTER CHOSEN IN ERROR

## 2016-09-04 NOTE — Consult Note (Signed)
WOC requested for scalp laceration.  EMR indicates this was repaired in the ER with staples.  Antibiotic ointment may be applied daily and site can remain open to air. Pt will need to have staples removed in 7-10 days. No further role for WOC team. Please re-consult if further assistance is needed.  Thank-you,  Cammie Mcgeeawn Leodan Bolyard MSN, RN, CWOCN, [place]CN-AP, CNS [phone]

## 2016-09-05 ENCOUNTER — Other Ambulatory Visit (HOSPITAL_COMMUNITY): Payer: Medicare Other

## 2016-09-05 LAB — CBC
HEMATOCRIT: 36.4 % (ref 36.0–46.0)
Hemoglobin: 11.2 g/dL — ABNORMAL LOW (ref 12.0–15.0)
MCH: 28.6 pg (ref 26.0–34.0)
MCHC: 30.8 g/dL (ref 30.0–36.0)
MCV: 92.9 fL (ref 78.0–100.0)
PLATELETS: 213 10*3/uL (ref 150–400)
RBC: 3.92 MIL/uL (ref 3.87–5.11)
RDW: 15.9 % — AB (ref 11.5–15.5)
WBC: 12.3 10*3/uL — ABNORMAL HIGH (ref 4.0–10.5)

## 2016-09-05 LAB — BASIC METABOLIC PANEL
Anion gap: 9 (ref 5–15)
BUN: 19 mg/dL (ref 6–20)
CALCIUM: 8.9 mg/dL (ref 8.9–10.3)
CO2: 30 mmol/L (ref 22–32)
CREATININE: 1.02 mg/dL — AB (ref 0.44–1.00)
Chloride: 99 mmol/L — ABNORMAL LOW (ref 101–111)
GFR calc non Af Amer: 56 mL/min — ABNORMAL LOW (ref >60)
GLUCOSE: 185 mg/dL — AB (ref 65–99)
Potassium: 4.2 mmol/L (ref 3.5–5.1)
Sodium: 138 mmol/L (ref 135–145)

## 2016-09-05 LAB — GLUCOSE, CAPILLARY
GLUCOSE-CAPILLARY: 142 mg/dL — AB (ref 65–99)
GLUCOSE-CAPILLARY: 183 mg/dL — AB (ref 65–99)
Glucose-Capillary: 185 mg/dL — ABNORMAL HIGH (ref 65–99)
Glucose-Capillary: 255 mg/dL — ABNORMAL HIGH (ref 65–99)
Glucose-Capillary: 273 mg/dL — ABNORMAL HIGH (ref 65–99)

## 2016-09-05 LAB — MRSA PCR SCREENING: MRSA BY PCR: NEGATIVE

## 2016-09-05 LAB — HEMOGLOBIN A1C
HEMOGLOBIN A1C: 6.1 % — AB (ref 4.8–5.6)
MEAN PLASMA GLUCOSE: 128.37 mg/dL

## 2016-09-05 LAB — MAGNESIUM: Magnesium: 2.2 mg/dL (ref 1.7–2.4)

## 2016-09-05 NOTE — Evaluation (Signed)
Occupational Therapy Evaluation Patient Details Name: Savannah Hernandez MRN: [id number] DOB: March 22, 1950 Today's Date: 09/05/2016    History of Present Illness Pt is a 66 year old female admitted secondary to fall with L knee injury and head laceration. PMH: DM, HTN, urinary incontinence, obesity, CKD, CVAS, CAD with stent.    Clinical Impression   Pt walked with a walker and performed ADL and IADL modified independently. Session limited today by LE pain L>R. Pt able to sit EOB 10 minutes with L LE propped on chair. Unable to tolerate standing. Pt requires set up to total assist for ADL. Pt requires extensive 2 person assist for bed level mobility. Pt will need post acute rehab in SNF.    Follow Up Recommendations  SNF;Supervision/Assistance - 24 hour    Equipment Recommendations       Recommendations for Other Services       Precautions / Restrictions Precautions Precautions: Fall Required Braces or Orthoses: Knee Immobilizer - Left Knee Immobilizer - Left: On when out of bed or walking Restrictions Weight Bearing Restrictions: No      Mobility Bed Mobility Overal bed mobility: Needs Assistance Bed Mobility: Supine to Sit;Sit to Supine;Rolling Rolling: Max assist   Supine to sit: +2 for physical assistance;Max assist Sit to supine: +2 for physical assistance;Max assist   General bed mobility comments: assist for all aspects, but pt able to assist with rail  Transfers                 General transfer comment: Pt not able to stand due to pain. Will need lift.    Balance Overall balance assessment: Needs assistance Sitting-balance support: Bilateral upper extremity supported Sitting balance-Leahy Scale: Fair Sitting balance - Comments: with L foot propped on chair                                   ADL either performed or assessed with clinical judgement   ADL Overall ADL's : Needs assistance/impaired Eating/Feeding: Independent;Bed level   Grooming: Set up;Bed level   Upper Body Bathing: Maximal assistance;Sitting   Lower Body Bathing: Total assistance;Bed level   Upper Body Dressing : Minimal assistance;Bed level   Lower Body Dressing: Total assistance;Sit to/from stand                       Vision Patient Visual Report: No change from baseline       Perception     Praxis      Pertinent Vitals/Pain Pain Assessment: 0-10 Pain Score: 10-Worst pain ever Pain Location: L LE Pain Descriptors / Indicators: Aching;Crying;Grimacing;Guarding Pain Intervention(s): Monitored during session;Repositioned;Limited activity within patient's tolerance     Hand Dominance Right   Extremity/Trunk Assessment Upper Extremity Assessment Upper Extremity Assessment: Generalized weakness   Lower Extremity Assessment Lower Extremity Assessment: Defer to PT evaluation       Communication Communication Communication: No difficulties   Cognition Arousal/Alertness: Awake/alert Behavior During Therapy: Anxious Overall Cognitive Status: Within Functional Limits for tasks assessed                                     General Comments       Exercises     Shoulder Instructions      Home Living Family/patient expects to be discharged to:: Private residence Living Arrangements: Alone Available  Help at Discharge: Family;Available PRN/intermittently Type of Home: Mobile home Home Access: Stairs to enter Entrance Stairs-Number of Steps: 4 Entrance Stairs-Rails: Right;Left Home Layout: One level     Bathroom Shower/Tub: Chief Strategy OfficerTub/shower unit   Bathroom Toilet: Handicapped height     Home Equipment: Cane - single point;Walker - 2 wheels;Walker - 4 wheels;Shower seat;Bedside commode;Toilet riser          Prior Functioning/Environment Level of Independence: Independent with assistive device(s)        Comments: pt uses RW to ambulate        OT Problem List: Decreased strength;Decreased  activity tolerance;Impaired balance (sitting and/or standing);Decreased safety awareness;Decreased knowledge of use of DME or AE;Obesity;Pain      OT Treatment/Interventions: Self-care/ADL training;DME and/or AE instruction;Therapeutic activities;Patient/family education;Balance training    OT Goals(Current goals can be found in the care plan section) Acute Rehab OT Goals Patient Stated Goal: pain relief OT Goal Formulation: With patient Time For Goal Achievement: 09/19/16 Potential to Achieve Goals: Good ADL Goals Pt Will Perform Upper Body Dressing: with set-up;with supervision;standing Pt Will Perform Lower Body Dressing: with adaptive equipment;sitting/lateral leans;with mod assist Pt Will Transfer to Toilet: with mod assist;stand pivot transfer;bedside commode Additional ADL Goal #1: Pt will perform bed mobility with moderate assistance in preparation for ADL..  OT Frequency: Min 2X/week   Barriers to D/C: Decreased caregiver support          Co-evaluation PT/OT/SLP Co-Evaluation/Treatment: Yes Reason for Co-Treatment: For patient/therapist safety   OT goals addressed during session: ADL's and self-care      AM-PAC PT "6 Clicks" Daily Activity     Outcome Measure Help from another person eating meals?: None Help from another person taking care of personal grooming?: A Little Help from another person toileting, which includes using toliet, bedpan, or urinal?: Total Help from another person bathing (including washing, rinsing, drying)?: A Lot Help from another person to put on and taking off regular upper body clothing?: A Little Help from another person to put on and taking off regular lower body clothing?: Total 6 Click Score: 14   End of Session Equipment Utilized During Treatment: Left knee immobilizer Nurse Communication: Mobility status;Need for lift equipment  Activity Tolerance: Patient limited by pain Patient left: in bed;with call bell/phone within reach  OT  Visit Diagnosis: Unsteadiness on feet (R26.81);Muscle weakness (generalized) (M62.81);Pain Pain - Right/Left: Left Pain - part of body: Leg                Time: [id number] OT Time Calculation (min): 26 min Charges:  OT General Charges $OT Visit: 1 Procedure OT Evaluation $OT Eval Moderate Complexity: 1 Procedure G-Codes:     Evern BioMayberry, Jw Covin Lynn 09/05/2016, 3:22 PM  [phone]

## 2016-09-05 NOTE — Clinical Social Work Note (Signed)
Clinical Social Work Assessment  Patient Details  Name: Savannah HillockKatherine F Klinger MRN: [id number] Date of Birth: [birth year]  Date of referral:  09/05/16               Reason for consult:  Facility Placement                Permission sought to share information with:  Facility Industrial/product designerContact Representative Permission granted to share information::  Yes, Verbal Permission Granted  Name::        Agency::  SNFs  Relationship::     Contact Information:     Housing/Transportation Living arrangements for the past 2 months:  Single Family Home Source of Information:  Patient Patient Interpreter Needed:  None Criminal Activity/Legal Involvement Pertinent to Current Situation/Hospitalization:  No - Comment as needed Significant Relationships:  Adult Children, Siblings Lives with:  Self Do you feel safe going back to the place where you live?  No Need for family participation in patient care:  No (Coment)  Care giving concerns:  CSW received consult for possible SNF placement at time of discharge. CSW spoke with patient regarding her request for SNF placement at time of discharge. Patient reported that she lives alone and is unable to care for herself at home given patient's current physical needs and fall risk. Patient expressed understanding that PT will need to make the recommendation. CSW to continue to follow and assist with discharge planning needs.   Social Worker assessment / plan:  CSW [facility] with patient [facility].  Employment status:  Retired Health and safety inspectornsurance information:  Armed forces operational officerMedicare, Medicaid [place] PT Recommendations:  Not assessed at this time Information / Referral to community resources:  Skilled Nursing Facility  Patient/Family's Response to care:  Patient recognizes need for rehab before returning home and [place] Patient reported preference for [facility] since her granddaughter works  there.  Patient/Family's Understanding of and Emotional Response to Diagnosis, Current Treatment, and Prognosis:  Patient/family is realistic regarding therapy needs and expressed being hopeful for SNF placement. Patient expressed understanding of CSW role and discharge process as well as her medical condition. She is fearful of falling again. No questions/concerns about plan or treatment.    Emotional Assessment Appearance:  Appears stated age Attitude/Demeanor/Rapport:  Other (Appropriate) Affect (typically observed):  Accepting, Appropriate Orientation:  Oriented to Self, Oriented to Place, Oriented to  Time, Oriented to Situation Alcohol / Substance use:  Not Applicable Psych involvement (Current and /or in the community):  No (Comment)  Discharge Needs  Concerns to be addressed:  Care Coordination Readmission within the last 30 days:  No Current discharge risk:  Lives alone Barriers to Discharge:  Continued Medical Work up   [employer]adia S Daralyn Bert, LCSWA 09/05/2016, 4:40 PM

## 2016-09-05 NOTE — Care Management (Signed)
Error encounter. 

## 2016-09-05 NOTE — NC FL2 (Signed)
MEDICAID FL2 LEVEL OF CARE SCREENING TOOL     IDENTIFICATION  Patient Name: Savannah Hernandez Birthdate: [birth year] Sex: female Admission Date (Current Location): 09/03/2016  [facility] and [place] Number:  Producer, television/film/video and Address:  [facility], [address], [place], [place] [address]      Provider Number: [id number]  Attending Physician Name and Address:  Albertine Grates, MD  Relative Name and Phone Number:       Current Level of Care: Hospital Recommended Level of Care: Skilled Nursing Facility Prior Approval Number:    Date Approved/Denied:   PASRR Number: [id number]  Discharge Plan: SNF    Current Diagnoses: Patient Active Problem List   Diagnosis Date Noted  . Fall 09/03/2016  . CAD (coronary artery disease) 09/03/2016  . Left knee injury 09/03/2016  . Leukocytosis 09/03/2016  . Hypertension   . Hypercholesteremia   . Diabetes mellitus without complication (HCC)     Orientation RESPIRATION BLADDER Height & Weight     Self, Time, Situation, Place  Normal Incontinent Weight: 112.6 kg (248 lb 3.2 oz) Height:  5' 2.5" (158.8 cm)  BEHAVIORAL SYMPTOMS/MOOD NEUROLOGICAL BOWEL NUTRITION STATUS      Continent Diet (Please see DC Summary)  AMBULATORY STATUS COMMUNICATION OF NEEDS Skin   Extensive Assist Verbally Other (Comment) (Laceration on head)                       Personal Care Assistance Level of Assistance  Bathing, Feeding, Dressing Bathing Assistance: Maximum assistance Feeding assistance: Independent Dressing Assistance: Limited assistance     Functional Limitations Info  Sight Sight Info: Impaired        SPECIAL CARE FACTORS FREQUENCY  PT (By licensed PT)     PT Frequency: not yet assessed              Contractures      Additional Factors Info  Code Status, Allergies, Insulin Sliding Scale Code Status Info: Full Allergies Info: Penicillins, Aspirin   Insulin Sliding Scale Info: 3x  daily with meals;2x daily       Current Medications (09/05/2016):  This is the current hospital active medication list Current Facility-Administered Medications  Medication Dose Route Frequency Provider Last Rate Last Dose  . acetaminophen (TYLENOL) tablet 650 mg  650 mg Oral Q6H PRN Lorretta Harp, MD       Or  . acetaminophen (TYLENOL) suppository 650 mg  650 mg Rectal Q6H PRN Lorretta Harp, MD      . aspirin EC tablet 81 mg  81 mg Oral Daily Lorretta Harp, MD   81 mg at 09/05/16 5621  . aztreonam (AZACTAM) 1 g in dextrose 5 % 50 mL IVPB  1 g Intravenous Q8H Armandina Stammer, RPH   Stopped at 09/05/16 1335  . calcium carbonate (OS-CAL - dosed in mg of elemental calcium) tablet 1,000 mg of elemental calcium  2 tablet Oral Q breakfast Lorretta Harp, MD   1,000 mg of elemental calcium at 09/05/16 0906  . carvedilol (COREG) tablet 12.5 mg  12.5 mg Oral BID WC Lorretta Harp, MD   12.5 mg at 09/05/16 0904  . clopidogrel (PLAVIX) tablet 75 mg  75 mg Oral Daily Lorretta Harp, MD   75 mg at 09/05/16 3086  . furosemide (LASIX) injection 40 mg  40 mg Intravenous BID Armandina Stammer, RPH   40 mg at 09/05/16 5784  . hydrALAZINE (APRESOLINE) injection 5 mg  5  mg Intravenous Q2H PRN Lorretta HarpNiu, Xilin, MD      . HYDROcodone-acetaminophen (NORCO/VICODIN) 5-325 MG per tablet 1 tablet  1 tablet Oral Q6H PRN Lorretta HarpNiu, Xilin, MD   1 tablet at 09/05/16 1542  . insulin aspart (novoLOG) injection 0-9 Units  0-9 Units Subcutaneous TID WC Lorretta HarpNiu, Xilin, MD   2 Units at 09/05/16 1304  . insulin glargine (LANTUS) injection 20 Units  20 Units Subcutaneous BID Cherre HugerCook, Rosetta R, ColoradoRPH   20 Units at 09/05/16 [id number]  . lisinopril (PRINIVIL,ZESTRIL) tablet 20 mg  20 mg Oral Daily Lorretta HarpNiu, Xilin, MD   20 mg at 09/05/16 0906  . loratadine (CLARITIN) tablet 10 mg  10 mg Oral Daily Lorretta HarpNiu, Xilin, MD   10 mg at 09/05/16 0904  . multivitamin with minerals tablet 1 tablet  1 tablet Oral Daily Lorretta HarpNiu, Xilin, MD   1 tablet at 09/05/16 0906  . neomycin-bacitracin-polymyxin  (NEOSPORIN) ointment   Topical Daily Albertine GratesXu, Anitra Doxtater, MD      . ondansetron [facility](ZOFRAN) injection 4 mg  4 mg Intravenous Q8H PRN Lorretta HarpNiu, Xilin, MD      . pravastatin (PRAVACHOL) tablet 40 mg  40 mg Oral Daily Lorretta HarpNiu, Xilin, MD   40 mg at 09/05/16 0904  . senna-docusate (Senokot-S) tablet 1 tablet  1 tablet Oral QHS PRN Lorretta HarpNiu, Xilin, MD      . spironolactone (ALDACTONE) tablet 25 mg  25 mg Oral Daily Lorretta HarpNiu, Xilin, MD   25 mg at 09/05/16 [id number]  . zolpidem (AMBIEN) tablet 5 mg  5 mg Oral QHS PRN Lorretta HarpNiu, Xilin, MD   5 mg at 09/04/16 [id number]     Discharge Medications: Please see discharge summary for a list of discharge medications.  Relevant Imaging Results:  Relevant Lab Results:   Additional Information SSN: [id number]  Nadia S ChicoRayyan, ConnecticutLCSWA

## 2016-09-05 NOTE — Progress Notes (Signed)
Orthopedic Tech Progress Note Patient Details:  Oswald HillockKatherine F Forsberg 07-Jun-1950 [id number]  Ortho Devices Type of Ortho Device: Knee Immobilizer Ortho Device/Splint Location: lle Ortho Device/Splint Interventions: Application   Sansa Alkema 09/05/2016, 1:22 PM

## 2016-09-05 NOTE — Progress Notes (Addendum)
PROGRESS NOTE  Savannah Hernandez ZOX:[id number] DOB: 11/17/1950 DOA: 09/03/2016 PCP: Simone Curia, MD  HPI/Recap of past 24 hours:  Spiked a low grade fever last night  at 100.5 She reports feeling better this am, She can not tolerate the knee immobilizer, report it made her knee pain worse She remain significant edematous, Urine output not accurate due to leaking female external catheter  Assessment/Plan: Principal Problem:   Fall Active Problems:   Hypertension   Hypercholesteremia   Diabetes mellitus without complication (HCC)   CAD (coronary artery disease)   Left knee injury   Leukocytosis  Fall and left knee injury:  Orthopedic surgeon, Dr. Jed Limerick was consulted by EDP-->no need for surgery per ortho.  Pt lives alone at home, cannot take care of herself. Patient may need rehabilitation placement. L knee immobilizer, Pain control, PT/OT  CHF exacerbation: no prior echo available, she presented with sob and worsening of bilateral lower extremity edema despite home diuretics will start iv lasix, continue spironolactone, lisinopril,  echo pending, she report she is on a study drug, will further investigate  H/o CAD: s/p of stent. No CP. -continue aspirin, plavix, pravastatin and Coreg  Insulin dependent Diabetes mellitus without complication:  Last A1c not on record. Patient is taking  glargine insulin, NovoLog, metformin at home -will decrease glargine insulin dose from 35-20 units twice a day , hold metformin,  recheck a1c 6.1 -SSI   Leukocytosis/UTI/fever:  Chest x-ray negative. ua + bacteria, WBC TNTC, + nitrite -she report weakness, denies current urinary symptom, spike low grade temp 100.5 -urine culture pending, continue  empirica abx aztreonam ( due to h/o pcn allergy) due to h/o diabetes   HTN:  -Continue Coreg, Lasix, spironolactone  HLD: -Pravastatin   Morbid obesity: Body mass index is 44.67 kg/m.   FTT: PT eval /SNF placement  Code  Status: full  Family Communication: patient   Disposition Plan: not ready for discharge yet, but will need  SNF at discharge   Consultants:  Orthopedics Dr Jed Limerick (phone consult)  Procedures:  none  Antibiotics:  Aztreonam    Objective: BP (!) 149/34   Pulse 66   Temp 99.6 F (37.6 C) (Oral)   Resp 20   Ht 5' 2.5" (1.588 m)   Wt 112.6 kg (248 lb 3.2 oz)   SpO2 93%   BMI 44.67 kg/m   Intake/Output Summary (Last 24 hours) at 09/05/16 2145 Last data filed at 09/05/16 1918  Gross per 24 hour  Intake              442 ml  Output             2900 ml  Net            -2458 ml   Filed Weights   09/03/16 1339 09/04/16 1632 09/05/16 0647  Weight: 113.4 kg (250 lb) 113.1 kg (249 lb 4.8 oz) 112.6 kg (248 lb 3.2 oz)    Exam: Patient is examined daily including today on 09/05/2016, exam remain the same as of yesterday except that is highlighted below   General:  Obese, frail, chronically ill appearing, NAD  Cardiovascular: RRR  Respiratory: diminished at basis, no rales, no wheezing,no rhonchi  Abdomen: Soft/ND/NT, positive BS  Musculoskeletal: bilateral pitting Edema up to knees with a few small blisters, tenderness left knee  Neuro: aaox3, hematoma in occipital area with suture   Data Reviewed: Basic Metabolic Panel:  Recent Labs Lab 09/03/16 1440 09/04/16 0210 09/05/16 0532  NA 140 140 138  K 4.1 4.5 4.2  CL 103 103 99*  CO2 27 26 30   GLUCOSE 115* 182* 185*  BUN 20 21* 19  CREATININE 0.92 0.88 1.02*  CALCIUM 9.6 9.1 8.9  MG  --   --  2.2   Liver Function Tests:  Recent Labs Lab 09/03/16 1440  AST 22  ALT 22  ALKPHOS 86  BILITOT 0.6  PROT 6.1*  ALBUMIN 3.5   No results for input(s): LIPASE, AMYLASE in the last 168 hours. No results for input(s): AMMONIA in the last 168 hours. CBC:  Recent Labs Lab 09/03/16 1440 09/04/16 0210 09/05/16 0532  WBC 15.7* 14.8* 12.3*  NEUTROABS 12.7*  --   --   HGB 12.8 11.9* 11.2*  HCT 42.4 39.5 36.4   MCV 92.4 92.3 92.9  PLT 245 230 213   Cardiac Enzymes:   No results for input(s): CKTOTAL, CKMB, CKMBINDEX, TROPONINI in the last 168 hours. BNP (last 3 results)  Recent Labs  09/03/16 1440  BNP 177.4*    ProBNP (last 3 results) No results for input(s): PROBNP in the last 8760 hours.  CBG:  Recent Labs Lab 09/04/16 2128 09/05/16 0752 09/05/16 1155 09/05/16 1158 09/05/16 1654  GLUCAP 260* 142* 183* 185* 255*    Recent Results (from the past 240 hour(s))  MRSA PCR Screening     Status: None   Collection Time: 09/05/16  8:44 AM  Result Value Ref Range Status   MRSA by PCR NEGATIVE NEGATIVE Final    Comment:        The GeneXpert MRSA Assay (FDA approved for NASAL specimens only), is one component of a comprehensive MRSA colonization surveillance program. It is not intended to diagnose MRSA infection nor to guide or monitor treatment for MRSA infections.      Studies: No results found.  Scheduled Meds: . aspirin EC  81 mg Oral Daily  . calcium carbonate  2 tablet Oral Q breakfast  . carvedilol  12.5 mg Oral BID WC  . clopidogrel  75 mg Oral Daily  . furosemide  40 mg Intravenous BID  . insulin aspart  0-9 Units Subcutaneous TID WC  . insulin glargine  20 Units Subcutaneous BID  . lisinopril  20 mg Oral Daily  . loratadine  10 mg Oral Daily  . multivitamin with minerals  1 tablet Oral Daily  . neomycin-bacitracin-polymyxin   Topical Daily  . pravastatin  40 mg Oral Daily  . spironolactone  25 mg Oral Daily    Continuous Infusions: . aztreonam 1 g (09/05/16 2059)     Time spent: 35mins I have personally reviewed and interpreted daily labs, tele strips, imagings as discussed above under date review session and assessment and plans.  Joycelyn Liska MD, PhD  [facility] Pager [phone]. If 7PM-7AM, please contact night-coverage at [email or web address], password [email or web address] 09/05/2016, 9:45 PM  LOS: 2 days

## 2016-09-05 NOTE — Evaluation (Signed)
Physical Therapy Evaluation Patient Details Name: Savannah HillockKatherine F Hernandez MRN: [id number] DOB: 1950-10-02 Today's Date: 09/05/2016   History of Present Illness  Pt is a 66 year old female admitted secondary to fall with L knee injury and head laceration. PMH: DM, HTN, urinary incontinence, obesity, CKD, CVAS, CAD with stent.   Clinical Impression  Pt presented supine in bed with HOB elevated, awake and willing to participate in therapy session. Prior to admission, pt reported that she was independent with ADLs and used a RW to ambulate. Pt currently very limited secondary to pain and requires heavy physical assist of two for bed mobility. Pt did not tolerate transfers this session. Pt would continue to benefit from skilled physical therapy services at this time while admitted and after d/c to address the below listed limitations in order to improve overall safety and independence with functional mobility.     Follow Up Recommendations SNF;Supervision/Assistance - 24 hour    Equipment Recommendations  None recommended by PT    Recommendations for Other Services       Precautions / Restrictions Precautions Precautions: Fall Required Braces or Orthoses: Knee Immobilizer - Left Knee Immobilizer - Left: On when out of bed or walking Restrictions Weight Bearing Restrictions: No      Mobility  Bed Mobility Overal bed mobility: Needs Assistance Bed Mobility: Supine to Sit;Sit to Supine;Rolling Rolling: Max assist   Supine to sit: +2 for physical assistance;Max assist Sit to supine: +2 for physical assistance;Max assist   General bed mobility comments: assist for all aspects, but pt able to assist with bilateral UEs on bed rail  Transfers                 General transfer comment: Pt not able to stand due to pain. Will need lift for OOB.  Ambulation/Gait                Stairs            Wheelchair Mobility    Modified Rankin (Stroke Patients Only)        Balance Overall balance assessment: Needs assistance Sitting-balance support: Bilateral upper extremity supported Sitting balance-Leahy Scale: Fair Sitting balance - Comments: with L foot propped on chair                                     Pertinent Vitals/Pain Pain Assessment: 0-10 Pain Score: 10-Worst pain ever Pain Location: L LE Pain Descriptors / Indicators: Aching;Crying;Grimacing;Guarding Pain Intervention(s): Monitored during session;Repositioned    Home Living Family/patient expects to be discharged to:: Private residence Living Arrangements: Alone Available Help at Discharge: Family;Available PRN/intermittently Type of Home: Mobile home Home Access: Stairs to enter Entrance Stairs-Rails: Right;Left Entrance Stairs-Number of Steps: 4 Home Layout: One level Home Equipment: Cane - single point;Walker - 2 wheels;Walker - 4 wheels;Shower seat;Bedside commode;Toilet riser      Prior Function Level of Independence: Independent with assistive device(s)         Comments: pt uses RW to ambulate     Hand Dominance   Dominant Hand: Right    Extremity/Trunk Assessment   Upper Extremity Assessment Upper Extremity Assessment: Defer to OT evaluation    Lower Extremity Assessment Lower Extremity Assessment: Generalized weakness (pt with reports of pain in bilateral knees (L>R))       Communication   Communication: No difficulties  Cognition Arousal/Alertness: Awake/alert Behavior During Therapy: Anxious Overall Cognitive Status:  Within Functional Limits for tasks assessed                                        General Comments      Exercises     Assessment/Plan    PT Assessment Patient needs continued PT services  PT Problem List Decreased strength;Decreased range of motion;Decreased activity tolerance;Decreased balance;Decreased coordination;Decreased mobility;Decreased knowledge of use of DME;Decreased safety  awareness;Decreased knowledge of precautions;Pain       PT Treatment Interventions DME instruction;Gait training;Stair training;Functional mobility training;Therapeutic activities;Therapeutic exercise;Balance training;Neuromuscular re-education;Patient/family education    PT Goals (Current goals can be found in the Care Plan section)  Acute Rehab PT Goals Patient Stated Goal: pain relief PT Goal Formulation: With patient Time For Goal Achievement: 09/19/16 Potential to Achieve Goals: Fair    Frequency Min 2X/week   Barriers to discharge        Co-evaluation PT/OT/SLP Co-Evaluation/Treatment: Yes Reason for Co-Treatment: For patient/therapist safety;To address functional/ADL transfers PT goals addressed during session: Mobility/safety with mobility;Balance OT goals addressed during session: ADL's and self-care       AM-PAC PT "6 Clicks" Daily Activity  Outcome Measure Difficulty turning over in bed (including adjusting bedclothes, sheets and blankets)?: Total Difficulty moving from lying on back to sitting on the side of the bed? : Total Difficulty sitting down on and standing up from a chair with arms (e.g., wheelchair, bedside commode, etc,.)?: Total Help needed moving to and from a bed to chair (including a wheelchair)?: Total Help needed walking in hospital room?: Total Help needed climbing 3-5 steps with a railing? : Total 6 Click Score: 6    End of Session   Activity Tolerance: Patient limited by pain Patient left: in bed;with call bell/phone within reach;with bed alarm set Nurse Communication: Mobility status PT Visit Diagnosis: Other abnormalities of gait and mobility (R26.89);Pain Pain - Right/Left: Left Pain - part of body: Knee    Time: 8469-6295 PT Time Calculation (min) (ACUTE ONLY): 28 min   Charges:   PT Evaluation $PT Eval Moderate Complexity: 1 Mod     PT G Codes:        [place], PT, DPT [phone]   Alessandra Bevels Lindsey Demonte 09/05/2016, 5:04  PM

## 2016-09-06 ENCOUNTER — Inpatient Hospital Stay (HOSPITAL_COMMUNITY): Payer: Medicare Other

## 2016-09-06 DIAGNOSIS — I5033 Acute on chronic diastolic (congestive) heart failure: Secondary | ICD-10-CM

## 2016-09-06 LAB — ECHOCARDIOGRAM COMPLETE
AOPV: 0.62 m/s
AV Area VTI index: 0.86 cm2/m2
AV Area VTI: 1.57 cm2
AV Mean grad: 9 mmHg
AV Peak grad: 21 mmHg
AV VEL mean LVOT/AV: 0.72
AV peak Index: 0.74
AV pk vel: 228 cm/s
AVAREAMEANV: 1.84 cm2
AVAREAMEANVIN: 0.87 cm2/m2
Area-P 1/2: 3.06 cm2
CHL CUP AV VALUE AREA INDEX: 0.86
CHL CUP AV VEL: 1.82
DOP CAL AO MEAN VELOCITY: 137 cm/s
E decel time: 246 msec
E/e' ratio: 12.34
FS: 38 % (ref 28–44)
HEIGHTINCHES: 62.5 in
IV/PV OW: 1
LA diam end sys: 31 mm
LADIAMINDEX: 1.47 cm/m2
LASIZE: 31 mm
LAVOLA4C: 63 mL
LDCA: 2.54 cm2
LV E/e' medial: 12.34
LV E/e'average: 12.34
LV TDI E'MEDIAL: 5.19
LVELAT: 8.51 cm/s
LVOT SV: 76 mL
LVOT VTI: 30.1 cm
LVOT peak VTI: 0.71 cm
LVOT peak grad rest: 8 mmHg
LVOTD: 18 mm
LVOTPV: 141 cm/s
MV Dec: 246
MV Peak grad: 4 mmHg
MV pk A vel: 100 m/s
MVPKEVEL: 105 m/s
MVSPHT: 72 ms
PW: 16 mm — AB (ref 0.6–1.1)
RV LATERAL S' VELOCITY: 16.2 cm/s
RV TAPSE: 27.7 mm
TDI e' lateral: 8.51
VTI: 42.1 cm
Valve area: 1.82 cm2
WEIGHTICAEL: 3964.8 [oz_av]

## 2016-09-06 LAB — CBC
HEMATOCRIT: 35.8 % — AB (ref 36.0–46.0)
Hemoglobin: 10.9 g/dL — ABNORMAL LOW (ref 12.0–15.0)
MCH: 28.2 pg (ref 26.0–34.0)
MCHC: 30.4 g/dL (ref 30.0–36.0)
MCV: 92.5 fL (ref 78.0–100.0)
PLATELETS: 227 10*3/uL (ref 150–400)
RBC: 3.87 MIL/uL (ref 3.87–5.11)
RDW: 15.8 % — ABNORMAL HIGH (ref 11.5–15.5)
WBC: 12.3 10*3/uL — AB (ref 4.0–10.5)

## 2016-09-06 LAB — BASIC METABOLIC PANEL
ANION GAP: 9 (ref 5–15)
BUN: 20 mg/dL (ref 6–20)
CHLORIDE: 98 mmol/L — AB (ref 101–111)
CO2: 29 mmol/L (ref 22–32)
Calcium: 8.9 mg/dL (ref 8.9–10.3)
Creatinine, Ser: 1.08 mg/dL — ABNORMAL HIGH (ref 0.44–1.00)
GFR calc Af Amer: >60 mL/min (ref >60)
GFR, EST NON AFRICAN AMERICAN: 52 mL/min — AB (ref >60)
GLUCOSE: 198 mg/dL — AB (ref 65–99)
POTASSIUM: 3.8 mmol/L (ref 3.5–5.1)
Sodium: 136 mmol/L (ref 135–145)

## 2016-09-06 LAB — GLUCOSE, CAPILLARY
GLUCOSE-CAPILLARY: 233 mg/dL — AB (ref 65–99)
Glucose-Capillary: 184 mg/dL — ABNORMAL HIGH (ref 65–99)
Glucose-Capillary: 196 mg/dL — ABNORMAL HIGH (ref 65–99)
Glucose-Capillary: 255 mg/dL — ABNORMAL HIGH (ref 65–99)

## 2016-09-06 LAB — URINE CULTURE

## 2016-09-06 MED ORDER — STUDY - INVESTIGATIONAL MEDICATION
1.0000 | Freq: Every day | Status: DC
  Administered 2016-09-06 – 2016-09-07 (×2): 1 via ORAL
  Filled 2016-09-06 (×2): qty 1

## 2016-09-06 MED ORDER — DOXYCYCLINE HYCLATE 100 MG PO TABS
100.0000 mg | ORAL_TABLET | Freq: Two times a day (BID) | ORAL | Status: DC
  Administered 2016-09-06 – 2016-09-07 (×2): 100 mg via ORAL
  Filled 2016-09-06 (×2): qty 1

## 2016-09-06 NOTE — Progress Notes (Signed)
  Echocardiogram 2D Echocardiogram has been performed.  Grayling Schranz T [place] Skipper 09/06/2016, 12:15 PM

## 2016-09-06 NOTE — Care Management Important Message (Signed)
Important Message  Patient Details  Name: Oswald HillockKatherine F Teem MRN: [id number] Date of Birth: 03/17/1950   Medicare Important Message Given:  Yes    Kyla BalzarineShealy, Satcha Storlie Abena 09/06/2016, 9:42 AM

## 2016-09-06 NOTE — Consult Note (Signed)
           [facility]  09/06/2016  Oswald HillockKatherine F Mendez [birth year] [id number]   Went to seepatientat the bedside to identify possible discharge needs.  Patient states that her "knees gave out" and fell hitting her head on the floor that had led to this admission. Patient endorses [facility]. Eloy EndKeung [facility]ewith [facility] as the primary care provider.   Patient shared [place] [facility] [place] and [facility] [place] to [place] without difficulty.   Patient reports managingher own medications at home.  Patientstates she wasdrivingprior to admission and hopes will be able to do the same after rehabilitation.   Patient lives alone and is fairly independent with ADL's prior to admission. Her sons live out of town and her sister lives out of town as well. She is unable to care for herself at home given her current physical needs and fall risk.  Anticipated discharge plan is skilled nursing facility (still in process) per therapist recommendation. Patient mentioned that she is not sure if she will be able to return back home (patient was tearful).  Patientagreed to call primary care provider's officeif ever she returns home,for a post discharge follow-up appointment within a week or sooner if needed.Patient letter (with PCP's contact number) was provided as areminder.  Explained to patient about [facility] services available for health management and she states that DM is under control with diet, medications and follow-up by endocrinologist. Herrecent A1c is 6.1.  Patient was encouraged to seek referralfrom primary care provider to [facility] care management services if deemed necessary in the future or if she returns back home.  [facility] care management information provided for future needs that may arise.  Primary care provider's office called [facility]([place]) to notify of patient's discharge plan and needs/ concerns needing  follow-up. Made aware to refer patient to [facility] care management if deemed appropriate for services once discharged home.   For questions, please contact:  Wyatt HasteLorraine Blaze Nylund, BSN, RN- [facility] Primary Care Navigator  Telephone: [phone] [facility]

## 2016-09-06 NOTE — Progress Notes (Signed)
PROGRESS NOTE  Savannah Hernandez ZOX:[id number] DOB: 1950/02/17 DOA: 09/03/2016 PCP: Simone Curia, MD  HPI/Recap of past 24 hours:  Spiked a low grade fever on 8/8 evening  at 100.5  tmax 100 last 24hs She reports feeling better this am, Less edematous Urine output 3700 last 24hrs   Assessment/Plan: Principal Problem:   Fall Active Problems:   Hypertension   Hypercholesteremia   Diabetes mellitus without complication (HCC)   CAD (coronary artery disease)   Left knee injury   Leukocytosis  Fall and left knee injury:  Orthopedic surgeon, Dr. Jed Limerick was consulted by EDP-->no need for surgery per ortho.  Pt lives alone at home, cannot take care of herself. Patient may need rehabilitation placement. L knee immobilizer, Pain control, PT/OT, SNF palcement  Acute on chronic Diastolic CHF exacerbation:  no prior echo available, she presented with sob and worsening of bilateral lower extremity edema despite home diuretics Edema down, continue spironolactone, lisinopril,  Continue iv lasix as long as renal function and bp is stable. echo this hospitalization showed lvef 60-65%, with grade I diastolic dysfunction  she report she is on a study drug for 9yrs, appreciate pharmacy input, can continue as long is not affected by the acute illness  H/o CAD: s/p of stent. No CP. -continue aspirin, plavix, pravastatin and Coreg  Insulin dependent Diabetes mellitus without complication:  Last A1c not on record. Patient is taking  glargine insulin, NovoLog, metformin at home -will decrease glargine insulin dose from 35-20 units twice a day , hold metformin,  recheck a1c 6.1 -SSI   Leukocytosis/UTI/fever:  Chest x-ray negative. ua + bacteria, WBC TNTC, + nitrite -she report weakness, denies current urinary symptom, spiked low grade temp 100.5, with leukocytosis, wbc 15.7 on admission -urine culture with multiple species, blood culture no growth, temperature curve trending down -She  was started on  empirica abx aztreonam ( due to h/o pcn allergy) due to h/o diabetes -clinically improving, d/c iv abx, change to oral doxy to finish total of 5 days treatment for presumed uti in an diabetic patient.   HTN:  -Continue Coreg, Lasix, spironolactone  HLD: -Pravastatin   Morbid obesity: Body mass index is 44.6 kg/m.   FTT: PT eval /SNF placement  Code Status: full  Family Communication: patient   Disposition Plan: improving, likely able to d/c in 1-2 days to  SNF   Consultants:  Orthopedics Dr Jed Limerick (phone consult)  Procedures:  none  Antibiotics:  Aztreonam from admission to 8/10  Doxycycline from 8/10   Objective: BP (!) 149/43 (BP Location: Right Arm)   Pulse 65   Temp 99.5 F (37.5 C) (Oral)   Resp 18   Ht 5' 2.5" (1.588 m)   Wt 112.4 kg (247 lb 12.8 oz)   SpO2 92%   BMI 44.60 kg/m   Intake/Output Summary (Last 24 hours) at 09/06/16 1903 Last data filed at 09/06/16 1800  Gross per 24 hour  Intake              780 ml  Output             3000 ml  Net            -2220 ml   Filed Weights   09/05/16 0647 09/06/16 0302 09/06/16 0545  Weight: 112.6 kg (248 lb 3.2 oz) 113.4 kg (250 lb) 112.4 kg (247 lb 12.8 oz)    Exam: Patient is examined daily including today on 09/06/2016, exam remain the same  as of yesterday except that is changed   General:  Obese, frail, chronically ill appearing, NAD  Cardiovascular: RRR  Respiratory: diminished at basis, no rales, no wheezing,no rhonchi  Abdomen: Soft/ND/NT, positive BS  Musculoskeletal: significant improvement of bilateral lower extremity pitting Edema,  left knee less tenderness  Neuro: aaox3, hematoma in occipital area with suture   Data Reviewed: Basic Metabolic Panel:  Recent Labs Lab 09/03/16 1440 09/04/16 0210 09/05/16 0532 09/06/16 0409  NA 140 140 138 136  K 4.1 4.5 4.2 3.8  CL 103 103 99* 98*  CO2 27 26 30 29   GLUCOSE 115* 182* 185* 198*  BUN 20 21* 19 20    CREATININE 0.92 0.88 1.02* 1.08*  CALCIUM 9.6 9.1 8.9 8.9  MG  --   --  2.2  --    Liver Function Tests:  Recent Labs Lab 09/03/16 1440  AST 22  ALT 22  ALKPHOS 86  BILITOT 0.6  PROT 6.1*  ALBUMIN 3.5   No results for input(s): LIPASE, AMYLASE in the last 168 hours. No results for input(s): AMMONIA in the last 168 hours. CBC:  Recent Labs Lab 09/03/16 1440 09/04/16 0210 09/05/16 0532 09/06/16 0409  WBC 15.7* 14.8* 12.3* 12.3*  NEUTROABS 12.7*  --   --   --   HGB 12.8 11.9* 11.2* 10.9*  HCT 42.4 39.5 36.4 35.8*  MCV 92.4 92.3 92.9 92.5  PLT 245 230 213 227   Cardiac Enzymes:   No results for input(s): CKTOTAL, CKMB, CKMBINDEX, TROPONINI in the last 168 hours. BNP (last 3 results)  Recent Labs  09/03/16 1440  BNP 177.4*    ProBNP (last 3 results) No results for input(s): PROBNP in the last 8760 hours.  CBG:  Recent Labs Lab 09/05/16 1654 09/05/16 2244 09/06/16 0745 09/06/16 1122 09/06/16 1708  GLUCAP 255* 273* 184* 196* 255*    Recent Results (from the past 240 hour(s))  Culture, Urine     Status: Abnormal   Collection Time: 09/04/16 11:15 PM  Result Value Ref Range Status   Specimen Description Urine  Final   Special Requests NONE  Final   Culture MULTIPLE SPECIES PRESENT, SUGGEST RECOLLECTION (A)  Final   Report Status 09/06/2016 FINAL  Final  MRSA PCR Screening     Status: None   Collection Time: 09/05/16  8:44 AM  Result Value Ref Range Status   MRSA by PCR NEGATIVE NEGATIVE Final    Comment:        The GeneXpert MRSA Assay (FDA approved for NASAL specimens only), is one component of a comprehensive MRSA colonization surveillance program. It is not intended to diagnose MRSA infection nor to guide or monitor treatment for MRSA infections.   Culture, blood (routine x 2)     Status: None (Preliminary result)   Collection Time: 09/05/16  9:05 AM  Result Value Ref Range Status   Specimen Description BLOOD LEFT HAND  Final   Special  Requests IN PEDIATRIC BOTTLE Blood Culture adequate volume  Final   Culture NO GROWTH 1 DAY  Final   Report Status PENDING  Incomplete  Culture, blood (routine x 2)     Status: None (Preliminary result)   Collection Time: 09/05/16  9:05 AM  Result Value Ref Range Status   Specimen Description LEFT ANTECUBITAL  Final   Special Requests BLOOD AEROBIC BOTTLE Blood Culture adequate volume  Final   Culture NO GROWTH 1 DAY  Final   Report Status PENDING  Incomplete  Studies: No results found.  Scheduled Meds: . aspirin EC  81 mg Oral Daily  . calcium carbonate  2 tablet Oral Q breakfast  . carvedilol  12.5 mg Oral BID WC  . clopidogrel  75 mg Oral Daily  . doxycycline  100 mg Oral Q12H  . furosemide  40 mg Intravenous BID  . insulin aspart  0-9 Units Subcutaneous TID WC  . insulin glargine  20 Units Subcutaneous BID  . Investigational - Study Medication  1 tablet Oral Daily  . lisinopril  20 mg Oral Daily  . loratadine  10 mg Oral Daily  . multivitamin with minerals  1 tablet Oral Daily  . neomycin-bacitracin-polymyxin   Topical Daily  . pravastatin  40 mg Oral Daily  . spironolactone  25 mg Oral Daily    Continuous Infusions:    Time spent: 35mins I have personally reviewed and interpreted daily labs, tele strips as discussed above under date review session and assessment and plans.  Case discussed with pharmacy at length regarding the study drug  Marce Schartz MD, PhD  [facility] Pager [phone]. If 7PM-7AM, please contact night-coverage at [email or web address], password [email or web address] 09/06/2016, 7:03 PM  LOS: 3 days

## 2016-09-07 DIAGNOSIS — I5033 Acute on chronic diastolic (congestive) heart failure: Secondary | ICD-10-CM | POA: Diagnosis not present

## 2016-09-07 DIAGNOSIS — Z9181 History of falling: Secondary | ICD-10-CM | POA: Diagnosis not present

## 2016-09-07 DIAGNOSIS — R627 Adult failure to thrive: Secondary | ICD-10-CM | POA: Diagnosis not present

## 2016-09-07 DIAGNOSIS — E785 Hyperlipidemia, unspecified: Secondary | ICD-10-CM | POA: Diagnosis not present

## 2016-09-07 DIAGNOSIS — Z5189 Encounter for other specified aftercare: Secondary | ICD-10-CM | POA: Diagnosis not present

## 2016-09-07 DIAGNOSIS — R262 Difficulty in walking, not elsewhere classified: Secondary | ICD-10-CM | POA: Diagnosis not present

## 2016-09-07 DIAGNOSIS — S0182XA Laceration with foreign body of other part of head, initial encounter: Secondary | ICD-10-CM | POA: Diagnosis not present

## 2016-09-07 DIAGNOSIS — R32 Unspecified urinary incontinence: Secondary | ICD-10-CM | POA: Diagnosis not present

## 2016-09-07 DIAGNOSIS — R7611 Nonspecific reaction to tuberculin skin test without active tuberculosis: Secondary | ICD-10-CM | POA: Diagnosis not present

## 2016-09-07 DIAGNOSIS — M1712 Unilateral primary osteoarthritis, left knee: Secondary | ICD-10-CM | POA: Diagnosis not present

## 2016-09-07 DIAGNOSIS — G8911 Acute pain due to trauma: Secondary | ICD-10-CM | POA: Diagnosis not present

## 2016-09-07 DIAGNOSIS — M6281 Muscle weakness (generalized): Secondary | ICD-10-CM | POA: Diagnosis not present

## 2016-09-07 DIAGNOSIS — E119 Type 2 diabetes mellitus without complications: Secondary | ICD-10-CM | POA: Diagnosis not present

## 2016-09-07 DIAGNOSIS — W19XXXA Unspecified fall, initial encounter: Secondary | ICD-10-CM | POA: Diagnosis not present

## 2016-09-07 DIAGNOSIS — S8990XA Unspecified injury of unspecified lower leg, initial encounter: Secondary | ICD-10-CM | POA: Diagnosis not present

## 2016-09-07 DIAGNOSIS — S83512A Sprain of anterior cruciate ligament of left knee, initial encounter: Secondary | ICD-10-CM | POA: Diagnosis not present

## 2016-09-07 DIAGNOSIS — W19XXXD Unspecified fall, subsequent encounter: Secondary | ICD-10-CM | POA: Diagnosis not present

## 2016-09-07 DIAGNOSIS — S0101XA Laceration without foreign body of scalp, initial encounter: Secondary | ICD-10-CM | POA: Diagnosis not present

## 2016-09-07 DIAGNOSIS — R41841 Cognitive communication deficit: Secondary | ICD-10-CM | POA: Diagnosis not present

## 2016-09-07 DIAGNOSIS — Z794 Long term (current) use of insulin: Secondary | ICD-10-CM | POA: Diagnosis not present

## 2016-09-07 DIAGNOSIS — I10 Essential (primary) hypertension: Secondary | ICD-10-CM | POA: Diagnosis not present

## 2016-09-07 DIAGNOSIS — R278 Other lack of coordination: Secondary | ICD-10-CM | POA: Diagnosis not present

## 2016-09-07 DIAGNOSIS — I2510 Atherosclerotic heart disease of native coronary artery without angina pectoris: Secondary | ICD-10-CM | POA: Diagnosis not present

## 2016-09-07 DIAGNOSIS — Z23 Encounter for immunization: Secondary | ICD-10-CM | POA: Diagnosis not present

## 2016-09-07 LAB — GLUCOSE, CAPILLARY
GLUCOSE-CAPILLARY: 178 mg/dL — AB (ref 65–99)
GLUCOSE-CAPILLARY: 302 mg/dL — AB (ref 65–99)

## 2016-09-07 LAB — BASIC METABOLIC PANEL
Anion gap: 10 (ref 5–15)
BUN: 25 mg/dL — ABNORMAL HIGH (ref 6–20)
CALCIUM: 8.8 mg/dL — AB (ref 8.9–10.3)
CHLORIDE: 99 mmol/L — AB (ref 101–111)
CO2: 28 mmol/L (ref 22–32)
CREATININE: 1 mg/dL (ref 0.44–1.00)
GFR calc Af Amer: >60 mL/min (ref >60)
GFR calc non Af Amer: 57 mL/min — ABNORMAL LOW (ref >60)
GLUCOSE: 248 mg/dL — AB (ref 65–99)
Potassium: 3.9 mmol/L (ref 3.5–5.1)
Sodium: 137 mmol/L (ref 135–145)

## 2016-09-07 LAB — CBC
HEMATOCRIT: 36.1 % (ref 36.0–46.0)
HEMOGLOBIN: 11.1 g/dL — AB (ref 12.0–15.0)
MCH: 27.9 pg (ref 26.0–34.0)
MCHC: 30.7 g/dL (ref 30.0–36.0)
MCV: 90.7 fL (ref 78.0–100.0)
Platelets: 234 10*3/uL (ref 150–400)
RBC: 3.98 MIL/uL (ref 3.87–5.11)
RDW: 15.5 % (ref 11.5–15.5)
WBC: 11.2 10*3/uL — ABNORMAL HIGH (ref 4.0–10.5)

## 2016-09-07 LAB — MAGNESIUM: Magnesium: 2.2 mg/dL (ref 1.7–2.4)

## 2016-09-07 MED ORDER — FUROSEMIDE 20 MG PO TABS: 40.0000 mg | ORAL_TABLET | Freq: Two times a day (BID) | ORAL | 0 refills | Status: AC

## 2016-09-07 MED ORDER — DOXYCYCLINE HYCLATE 100 MG PO TABS: 100.0000 mg | ORAL_TABLET | Freq: Two times a day (BID) | ORAL | 0 refills | Status: AC

## 2016-09-07 MED ORDER — INSULIN ASPART 100 UNIT/ML FLEXPEN: PEN_INJECTOR | SUBCUTANEOUS | 0 refills | Status: AC

## 2016-09-07 MED ORDER — SENNOSIDES-DOCUSATE SODIUM 8.6-50 MG PO TABS: 1.0000 | ORAL_TABLET | Freq: Every day | ORAL | 0 refills | Status: AC

## 2016-09-07 MED ORDER — HYDROCODONE-ACETAMINOPHEN 5-325 MG PO TABS: 1.0000 | ORAL_TABLET | Freq: Four times a day (QID) | ORAL | 0 refills | Status: AC | PRN

## 2016-09-07 NOTE — Progress Notes (Signed)
Pt was d/c'd per md orders. pts iv was removed and site was clean. Pt given scripts and AVS. Pt left via PTAR with son. Pt was stable leaving the floor. Report was called to [facility] and spoke with [facility] RN.

## 2016-09-07 NOTE — Clinical Social Work Note (Signed)
Clinical Social Worker facilitated patient discharge including contacting patient family and facility to confirm patient discharge plans. Clinical information faxed to facility and family agreeable with plan. CSW arranged ambulance transport via [facility] to [facility]. RN to call report to [phone]  prior to discharge.  Clinical Social Worker will sign off for now as social work intervention is no longer needed. Please consult us again if new need arises.  Hellena Pridgen B. Gean QuintBrown,MSW, LCSWA Clinical Social Work Dept Weekend Social Worker [phone] 11:47 AM

## 2016-09-07 NOTE — Clinical Social Work Placement (Signed)
   CLINICAL SOCIAL WORK PLACEMENT  NOTE  Date:  09/07/2016  Patient Details  Name: Savannah Hernandez MRN: [id number] Date of Birth: 03-Sep-1950  Clinical Social Work is seeking post-discharge placement for this patient at the Skilled  Nursing Facility level of care (*CSW will initial, date and re-position this form in  chart as items are completed):  Yes   Patient/family provided with [facility] Social Work Department's list of facilities offering this level of care within the geographic area requested by the patient (or if unable, by the patient's family).  Yes   Patient/family informed of their freedom to choose among providers that offer the needed level of care, that participate in Medicare, Medicaid or managed care program needed by the patient, have an available bed and are willing to accept the patient.  Yes   Patient/family informed of [facility]'s ownership interest [facility] and [facility], as well as of the fact that they are under no obligation to receive care at these facilities.  PASRR submitted to EDS on 09/05/16     PASRR number received on 09/05/16     Existing PASRR number confirmed on       FL2 transmitted to all facilities in geographic area requested by pt/family on 09/05/16     FL2 transmitted to all facilities within larger geographic area on       Patient informed that his/her managed care company has contracts with or will negotiate with certain facilities, including the following:        Yes   Patient/family informed of bed offers received.  Patient chooses bed at  [facility]     Physician recommends and patient chooses bed at      Patient to be transferred to  ([facility]) on 09/07/16.  Patient to be transferred to facility by  [facility])     Patient family notified on 09/07/16 of transfer.  Name of family member notified:  EC     PHYSICIAN       Additional Comment:     _______________________________________________ Norlene DuelBROWN, Kie Calvin B, LCSWA 09/07/2016, 11:51 AM

## 2016-09-07 NOTE — Discharge Summary (Signed)
Discharge Summary  Savannah Hernandez ZOX:[id number] DOB: February 11, 1950  PCP: Savannah Curia, MD  Admit date: 09/03/2016 Discharge date: 09/07/2016  Time spent: >61mins, more than 50% time spent on coordination of care  Recommendations for Outpatient Follow-up:  1. F/u with SNF MD  for hospital discharge follow up, repeat cbc/bmp at follow up, lasix dose need to be continue adjusted according to renal function and volume status 2. F/u with orthopedic Dr Savannah Hernandez for left knee pain  Discharge Diagnoses:  Active Hospital Problems   Diagnosis Date Noted  . Fall 09/03/2016  . CAD (coronary artery disease) 09/03/2016  . Left knee injury 09/03/2016  . Leukocytosis 09/03/2016  . Hypercholesteremia   . Hypertension   . Diabetes mellitus without complication [facility])     Resolved Hospital Problems   Diagnosis Date Noted Date Resolved  No resolved problems to display.    Discharge Condition: stable  Diet recommendation: heart healthy/carb modified  Filed Weights   09/06/16 0302 09/06/16 0545 09/07/16 0507  Weight: 113.4 kg (250 lb) 112.4 kg (247 lb 12.8 oz) 108.4 kg (239 lb)    History of present illness:  PCP: Savannah Curia, MD   Patient coming from:  The patient is coming from home.  At baseline, pt is independent for most of ADL.  Chief Complaint: fall and left knee injury  HPI: Savannah Hernandez is a 66 y.o. female with medical history significant of hypertension, hyperlipidemia, diabetes mellitus, stroke, morbid obesity, CAD, stent placement, who presents with fall and left knee injury.  Patient states that she has chronic bilateral knee pain. Pt states that she went to doctors office this morning and had to walk a long distance d/t no handicapp parking spaces. After back home, she was walking in kitchen, her knees gave out. She fell backwards on the floor, hitting head on floor. Pt developed hematoma in the back of head, with bleeding. She developed severe pain in the  left knee, which is constant, 10 out of 10 in severity, sharp, nonradiating. Patient does not have unilateral weakness numbness or tingling to extremities. Denies chest pain, SOB, cough. No nausea, vomiting, diarrhea or abdominal pain. No symptoms of UTI.  ED Course: pt was found to have WBC 15.7, negative troponin, electrolytes renal function okay, temperature normal, bradycardia, AND sexual 91-96% on room air. Chest x-ray negative. CT head at the CT of C-spine is negative for acute abnormalities. X-ray of her right knee is negative for acute bony abnormalities. Pt is admitted to med-surg bed as inpt.   MRI of the left knee showed 1. 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 Reactive marrow edema and subchondral degenerative cystic change is noted secondary to osteoarthritis. Probable small focus of bone marrow contusions of the anterior tibial epiphysis. 2. Inferior articular surfacing tear of the posterior horn. A 7 x 8 x 10 mm similar attenuating mass posterior to posterior horn may reflect a displaced meniscal fragment possibly off the anterior horn. 3. Marked attenuation and edema of the ACL suspicious for a high-grade ACL sprain/ tear. 4. Diffuse periarticular subcutaneous soft tissue edema.  Hospital Course:  Principal Problem:   Fall Active Problems:   Hypertension   Hypercholesteremia   Diabetes mellitus without complication (HCC)   CAD (coronary artery disease)   Left knee injury   Leukocytosis   Fall and left knee injury: (mri left knee result as mentioned above) Orthopedic surgeon, Dr. Jed Hernandez was consulted by EDP-->no need for surgery per ortho.  Pt lives alone at home, cannot take care of herself. Patient may need rehabilitation placement. Lknee immobilizer, Pain control, PT/OT, SNF placement, outpatient follow up with orthopedics.  Acute on chronic Diastolic CHF exacerbation:  no prior echo available, she  presented with sob and worsening of bilateral lower extremity edema despite home diuretics.  echo this hospitalization showed lvef 60-65%, with grade I diastolic dysfunction continue spironolactone, lisinopril,  she received iv lasix 40mg  bid since admission. Good urine output, over all -5.4liter, Edema down, renal function is stable, she is discharged on oral lasix 40mg  bid ( this is a increase from her home dose lasix 20mg  bid)   H/o CAD: s/p of stent. No CP. -continue aspirin, plavix, pravastatin and Coreg  Insulin dependent Diabetes mellitus without complication: . Patient is taking glargine insulin, NovoLog, metformin at home - metformin held in the hospital, resumed at discharge,  a1c 6.1 -she is discharged on home dose insulin   Leukocytosis/UTI/fever: -Chest x-ray negative.  ua + bacteria, WBC TNTC, + nitrite, urine culture with multiple species, blood culture no growth, -she reports weakness, denies current urinary symptom, spiked low grade temp 100.5 on 8/8, with leukocytosis, wbc 15.7 on admission - She was started on  empirica abx aztreonam ( due to h/o pcn allergy) due to h/o diabetes -clinically improving, temperature curve trending down, d/c iv abx, change to oral doxy to finish total of 5 days treatment for presumed uti in a diabetic patient.   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   FTT: PT eval /SNF placement  Code Status: full  Family Communication: patient   Disposition Plan:  d/c to SNF   Consultants:  Orthopedics Dr Savannah Hernandez (phone consult)  Procedures:  none  Antibiotics:  Aztreonam from admission to 8/10  Doxycycline from 8/10    Discharge Exam: BP (!) 138/42 (BP Location: Right Arm)    Pulse (!) 58   Temp 97.7 F (36.5 C) (Oral)   Resp 20   Ht 5' 2.5" (1.588 m)   Wt 108.4 kg (239 lb)   SpO2 95%   BMI 43.02 kg/m    General:  Obese, frail, chronically ill appearing, NAD  Cardiovascular: RRR  Respiratory: diminished at basis, no rales, no wheezing,no rhonchi  Abdomen: Soft/ND/NT, positive BS  Musculoskeletal: significant improvement of bilateral lower extremity pitting Edema,  left knee less tenderness  Neuro: aaox3, hematoma in occipital area , suture removed.     Discharge Instructions You were cared for by a hospitalist during your hospital stay. If you have any questions about your discharge medications or the care you received while you were in the hospital after you are discharged, you can call the unit and asked to speak with the hospitalist on call if the hospitalist that took care of you is not available. Once you are discharged, your primary care physician will handle any further medical issues. Please note that NO REFILLS for any discharge medications will be authorized once you are discharged, as it is imperative that you return to your primary care physician (or establish a relationship with a primary care physician if you do not have one) for your aftercare needs so that they can reassess your need for medications and monitor your lab values.  Discharge Instructions    Diet - low sodium heart healthy    Complete by:  As directed    Carb modified   Increase activity slowly    Complete by:  As directed      Allergies as of 09/07/2016      Reactions   Penicillins Hives, Shortness Of Breath   Aspirin Other (See Comments)   Nausea and feels like something is "eating at stomach".       Medication List    TAKE these medications   aspirin EC 81 MG tablet Take 81 mg by mouth daily.   BASAGLAR KWIKPEN 100 UNIT/ML Sopn Inject 35 Units into the skin 2 (two) times daily.   calcium carbonate 1250 (500 Ca) MG tablet Commonly known as:  OS-CAL - dosed  in mg of elemental calcium Take 2 tablets by mouth daily.   carvedilol 12.5 MG tablet Commonly known as:  COREG Take 12.5 mg by mouth 2 (two) times daily with a meal.   clopidogrel 75 MG tablet Commonly known as:  PLAVIX Take 75 mg by mouth daily.   doxycycline 100 MG tablet Commonly known as:  VIBRA-TABS Take 1 tablet (100 mg total) by mouth every 12 (twelve) hours.   furosemide 20 MG tablet Commonly known as:  LASIX Take 2 tablets (40 mg total) by mouth 2 (two) times daily. What changed:  how much to take   GLUCOSAMINE CHONDR 500 COMPLEX PO Take 2,000 mg by mouth daily.   HYDROcodone-acetaminophen 5-325 MG tablet Commonly known as:  NORCO/VICODIN Take 1 tablet by mouth every 6 (six) hours as needed for moderate pain.   insulin aspart 100 UNIT/ML FlexPen Commonly known as:  NOVOLOG FLEXPEN Before each meal 3 times a day, 140-199 - 2 units, 200-250 - 4 units, 251-299 - 6 units,  300-349 - 8 units,  350 or above 10 units. Insulin PEN if approved, provide syringes and needles if needed. What changed:  how much to take  how to take this  when to take this  additional instructions   Investigational - Study Medication Take 1 tablet by mouth every morning. Light pink tablet with PT BAY [phone]   levocetirizine 5 MG tablet Commonly known as:  XYZAL Take 5 mg by mouth every morning.   lisinopril 20 MG tablet Commonly known as:  PRINIVIL,ZESTRIL Take 20 mg by mouth daily.   metFORMIN 500 MG 24 hr tablet Commonly known as:  GLUCOPHAGE-XR Take 1,000 mg by mouth 2 (two) times daily.   multivitamin with minerals Tabs tablet Take 1 tablet by mouth daily.   pravastatin 40 MG tablet Commonly known as:  PRAVACHOL Take 40 mg by mouth daily.   PRESERVISION AREDS 2 PO Take 1 capsule by mouth 2 (two) times daily.   senna-docusate 8.6-50 MG tablet Commonly known as:  Senokot-S Take 1 tablet by mouth at bedtime. Hold if diarrhea   spironolactone 25 MG  tablet Commonly known as:  ALDACTONE Take 25 mg by mouth daily.      Allergies  Allergen Reactions  . Penicillins Hives and Shortness Of Breath  . Aspirin Other (See Comments)    Nausea and feels like something is "eating at stomach".    Follow-up Information    Tarry Kos, MD Follow up in 3 week(s).   Specialty:  Orthopedic Surgery Why:  left knee pain Contact information: [address] [place] [address] [phone]            The results of  significant diagnostics from this hospitalization (including imaging, microbiology, ancillary and laboratory) are listed below for reference.    Significant Diagnostic Studies: Dg Chest 2 View  Result Date: 09/03/2016 CLINICAL DATA:  66 year old female status post fall backwards in kitchen today. Chest and bilateral knee pain and swelling. EXAM: CHEST  2 VIEW [facility] FINDINGS: Seated upright AP and lateral views of the chest. Stable cardiomegaly and mediastinal contours. Stable lung volumes. Chronic increased pulmonary interstitial markings. No pneumothorax, pulmonary edema, pleural effusion or acute pulmonary opacity. No acute osseous abnormality identified. Negative visible bowel gas pattern. IMPRESSION: Stable cardiomegaly. No acute cardiopulmonary abnormality. Electronically Signed   By: Odessa FlemingH  Hall M.D.   On: 09/03/2016 16:12   Dg Knee 2 Views Left  Result Date: 09/03/2016 CLINICAL DATA:  66 year old female status post fall backwards in kitchen today. Chest and bilateral knee pain and swelling. EXAM: LEFT KNEE - 1-2 VIEW COMPARISON:  None. FINDINGS: AP and cross-table lateral views. Chronic but progressed and severe medial compartment joint space loss. Chronic tricompartmental degenerative spurring. Small to moderate suprapatellar joint effusion suspected. New cortical irregularity along the anterior aspect of a femoral condyles cross-table lateral view (arrow).  Underlying chronic distal femoral degenerative spurring. The patella appears intact. No proximal tibia or fibula fracture identified. Calcified peripheral vascular disease. Generalized soft tissue stranding and swelling. IMPRESSION: 1. Difficult to exclude an acute fracture of the anterior left femoral condyle. Knee MRI without contrast would probably be most valuable at this point, but failing that a noncontrast knee CT could evaluate further. 2. Joint effusion. Progressed and severe medial compartment joint degeneration. 3. Nonspecific generalized right lower extremity soft tissue edema. 4. Calcified peripheral vascular disease. Electronically Signed   By: Odessa FlemingH  Hall M.D.   On: 09/03/2016 16:16   Dg Knee 2 Views Right  Result Date: 09/03/2016 CLINICAL DATA:  66 year old female status post fall backwards in kitchen today. Chest and bilateral knee pain and swelling. EXAM: RIGHT KNEE - 1-2 VIEW COMPARISON:  Right knee 08/07/2011. FINDINGS: Possible small right knee joint effusion. Lateral predominant joint space loss and subchondral sclerosis. The patella appears intact. No acute osseous abnormality identified. Calcified peripheral vascular disease. Generalized soft tissue swelling and stranding. IMPRESSION: 1. No acute osseous abnormality identified. Possible small joint effusion. 2. Generalized nonspecific right lower extremity soft tissue edema. Calcified peripheral vascular disease. Electronically Signed   By: Odessa FlemingH  Hall M.D.   On: 09/03/2016 16:17   Ct Head Wo Contrast  Result Date: 09/03/2016 CLINICAL DATA:  Patient status post fall. No reported loss consciousness. EXAM: CT HEAD WITHOUT CONTRAST CT CERVICAL SPINE WITHOUT CONTRAST TECHNIQUE: Multidetector CT imaging of the head and cervical spine was performed following the standard protocol without intravenous contrast. Multiplanar CT image reconstructions of the cervical spine were also generated. COMPARISON:  Brain CT 09/21/2006 FINDINGS: CT HEAD FINDINGS  Brain: Ventricles and sulci are appropriate for patient's age. No evidence for acute cortically based infarct, intracranial hemorrhage, mass lesion or mass-effect. Unchanged hypodensities within the right cerebellar hemisphere and left basal ganglia location which are nonspecific and may represent dilated prevascular spaces or old lacunar infarcts. Vascular: Internal carotid arterial vascular calcifications. Skull: Intact. Sinuses/Orbits: Paranasal sinuses are well aerated. Left mastoid air cells are unremarkable. Under pneumatization of the right mastoid air cells. Other: Mild soft tissue swelling overlying the dorsal calvarium. CT CERVICAL SPINE FINDINGS Alignment: Normal. Skull base and vertebrae: No acute fracture. No primary bone lesion or focal pathologic process. Soft tissues and  spinal canal: No prevertebral fluid or swelling. No visible canal hematoma. Disc levels: Degenerative disc disease most pronounced C6-7 with anterior endplate osteophytosis. No evidence for acute fracture. Upper chest: Unremarkable Other: None. IMPRESSION: No acute intracranial process. Mild soft tissue swelling overlying the dorsal calvarium. No acute cervical spine fracture.  Degenerative disc disease. Electronically Signed   By: Annia Belt M.D.   On: 09/03/2016 14:47   Ct Cervical Spine Wo Contrast  Result Date: 09/03/2016 CLINICAL DATA:  Patient status post fall. No reported loss consciousness. EXAM: CT HEAD WITHOUT CONTRAST CT CERVICAL SPINE WITHOUT CONTRAST TECHNIQUE: Multidetector CT imaging of the head and cervical spine was performed following the standard protocol without intravenous contrast. Multiplanar CT image reconstructions of the cervical spine were also generated. COMPARISON:  Brain CT 09/21/2006 FINDINGS: CT HEAD FINDINGS Brain: Ventricles and sulci are appropriate for patient's age. No evidence for acute cortically based infarct, intracranial hemorrhage, mass lesion or mass-effect. Unchanged hypodensities  within the right cerebellar hemisphere and left basal ganglia location which are nonspecific and may represent dilated prevascular spaces or old lacunar infarcts. Vascular: Internal carotid arterial vascular calcifications. Skull: Intact. Sinuses/Orbits: Paranasal sinuses are well aerated. Left mastoid air cells are unremarkable. Under pneumatization of the right mastoid air cells. Other: Mild soft tissue swelling overlying the dorsal calvarium. CT CERVICAL SPINE FINDINGS Alignment: Normal. Skull base and vertebrae: No acute fracture. No primary bone lesion or focal pathologic process. Soft tissues and spinal canal: No prevertebral fluid or swelling. No visible canal hematoma. Disc levels: Degenerative disc disease most pronounced C6-7 with anterior endplate osteophytosis. No evidence for acute fracture. Upper chest: Unremarkable Other: None. IMPRESSION: No acute intracranial process. Mild soft tissue swelling overlying the dorsal calvarium. No acute cervical spine fracture.  Degenerative disc disease. Electronically Signed   By: Annia Belt M.D.   On: 09/03/2016 14:47   Mr Knee Left Wo Contrast  Result Date: 09/03/2016 CLINICAL DATA:  Left knee pain after fall today. EXAM: MRI OF THE LEFT KNEE WITHOUT CONTRAST TECHNIQUE: Multiplanar, multisequence MR imaging of the knee was performed. No intravenous contrast was administered. COMPARISON:  None. FINDINGS: Limited by motion artifacts. Best images obtained were provided for interpretation. MENISCI Medial meniscus: Inferior articular surfacing tear of the posterior horn of the medial meniscus. There is a 7 x 8 x 10 mm hypointense mass just posterior to the posterior horn and a displaced meniscal fragment possibly off the anterior horn given its absent appearance is a possibility for this finding. The remainder of the anterior horn is subluxed anteriorly. Lateral meniscus: Macerated appearance of the anterior horn of the lateral meniscus. LIGAMENTS Cruciates:  Indistinct appearance of the ACL with a few intact appearing fibers raise concern for a high-grade ACL sprain. The PCL appears intact. Collaterals: Medial collateral ligament is intact. Lateral collateral ligament complex is intact. CARTILAGE Patellofemoral: Irregular cartilaginous thinning and fraying of the medial patellar facet cartilage with more homogeneous marked chondral loss down to bone involving the lateral patellar cartilage. Subchondral degenerative cystic changes are seen deep to the tibial eminence and lateral patellar facet. Fraying of the trochlear cartilage is also noted. Medial: Marked medial femorotibial chondromalacia with near bone-on-bone appearance. Lateral: Marked chondromalacia of the tibial plateau cartilage of the lateral femorotibial compartment with degenerative subchondral edema and early cystic change noted of the tibial plateau and weight-bearing portion of the lateral femoral condyle. Joint:  Moderate size suprapatellar joint effusion with thin plica. Popliteal Fossa: Large popliteal cyst measuring 5.4 x 1.9 x 2.7  cm. Intact popliteus. Extensor Mechanism:  Intact quadriceps tendon and patellar tendon. Bones: Tricompartmental osteoarthritis with reactive marrow edema as above described. No acute fracture identified allowing for patient motion. There does appear to be some anterior tibial epiphyseal bone marrow edema/contusion. Other: Moderate degree of diffuse soft tissue edema about the knee. IMPRESSION: 1. 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 Reactive marrow edema and subchondral degenerative cystic change is noted secondary to osteoarthritis. Probable small focus of bone marrow contusions of the anterior tibial epiphysis. 2. Inferior articular surfacing tear of the posterior horn. A 7 x 8 x 10 mm similar attenuating mass posterior to posterior horn may reflect a displaced meniscal fragment possibly off the anterior  horn. 3. Marked attenuation and edema of the ACL suspicious for a high-grade ACL sprain/ tear. 4. Diffuse periarticular subcutaneous soft tissue edema. Electronically Signed   By: Tollie Eth M.D.   On: 09/03/2016 19:22    Microbiology: Recent Results (from the past 240 hour(s))  Culture, Urine     Status: Abnormal   Collection Time: 09/04/16 11:15 PM  Result Value Ref Range Status   Specimen Description Urine  Final   Special Requests NONE  Final   Culture MULTIPLE SPECIES PRESENT, SUGGEST RECOLLECTION (A)  Final   Report Status 09/06/2016 FINAL  Final  MRSA PCR Screening     Status: None   Collection Time: 09/05/16  8:44 AM  Result Value Ref Range Status   MRSA by PCR NEGATIVE NEGATIVE Final    Comment:        The GeneXpert MRSA Assay (FDA approved for NASAL specimens only), is one component of a comprehensive MRSA colonization surveillance program. It is not intended to diagnose MRSA infection nor to guide or monitor treatment for MRSA infections.   Culture, blood (routine x 2)     Status: None (Preliminary result)   Collection Time: 09/05/16  9:05 AM  Result Value Ref Range Status   Specimen Description BLOOD LEFT HAND  Final   Special Requests IN PEDIATRIC BOTTLE Blood Culture adequate volume  Final   Culture NO GROWTH 1 DAY  Final   Report Status PENDING  Incomplete  Culture, blood (routine x 2)     Status: None (Preliminary result)   Collection Time: 09/05/16  9:05 AM  Result Value Ref Range Status   Specimen Description LEFT ANTECUBITAL  Final   Special Requests BLOOD AEROBIC BOTTLE Blood Culture adequate volume  Final   Culture NO GROWTH 1 DAY  Final   Report Status PENDING  Incomplete     Labs: Basic Metabolic Panel:  Recent Labs Lab 09/03/16 1440 09/04/16 0210 09/05/16 0532 09/06/16 0409 09/07/16 0455  NA 140 140 138 136 137  K 4.1 4.5 4.2 3.8 3.9  CL 103 103 99* 98* 99*  CO2 27 26 30 29 28   GLUCOSE 115* 182* 185* 198* 248*  BUN 20 21* 19 20 25*   CREATININE 0.92 0.88 1.02* 1.08* 1.00  CALCIUM 9.6 9.1 8.9 8.9 8.8*  MG  --   --  2.2  --  2.2   Liver Function Tests:  Recent Labs Lab 09/03/16 1440  AST 22  ALT 22  ALKPHOS 86  BILITOT 0.6  PROT 6.1*  ALBUMIN 3.5   No results for input(s): LIPASE, AMYLASE in the last 168 hours. No results for input(s): AMMONIA in the last 168 hours. CBC:  Recent Labs Lab 09/03/16 1440 09/04/16 0210 09/05/16 0532 09/06/16 0409 09/07/16 0455  WBC  15.7* 14.8* 12.3* 12.3* 11.2*  NEUTROABS 12.7*  --   --   --   --   HGB 12.8 11.9* 11.2* 10.9* 11.1*  HCT 42.4 39.5 36.4 35.8* 36.1  MCV 92.4 92.3 92.9 92.5 90.7  PLT 245 230 213 227 234   Cardiac Enzymes: No results for input(s): CKTOTAL, CKMB, CKMBINDEX, TROPONINI in the last 168 hours. BNP: BNP (last 3 results)  Recent Labs  09/03/16 1440  BNP 177.4*    ProBNP (last 3 results) No results for input(s): PROBNP in the last 8760 hours.  CBG:  Recent Labs Lab 09/06/16 0745 09/06/16 1122 09/06/16 1708 09/06/16 2205 09/07/16 0802  GLUCAP 184* 196* 255* 233* 178*       SignedAlbertine Grates MD, PhD  [facility] 09/07/2016, 11:34 AM

## 2016-09-08 DIAGNOSIS — S0182XA Laceration with foreign body of other part of head, initial encounter: Secondary | ICD-10-CM | POA: Diagnosis not present

## 2016-09-08 DIAGNOSIS — E785 Hyperlipidemia, unspecified: Secondary | ICD-10-CM | POA: Diagnosis not present

## 2016-09-08 DIAGNOSIS — R32 Unspecified urinary incontinence: Secondary | ICD-10-CM | POA: Diagnosis not present

## 2016-09-08 DIAGNOSIS — I10 Essential (primary) hypertension: Secondary | ICD-10-CM | POA: Diagnosis not present

## 2016-09-08 DIAGNOSIS — S83512A Sprain of anterior cruciate ligament of left knee, initial encounter: Secondary | ICD-10-CM | POA: Diagnosis not present

## 2016-09-08 DIAGNOSIS — E119 Type 2 diabetes mellitus without complications: Secondary | ICD-10-CM | POA: Diagnosis not present

## 2016-09-08 DIAGNOSIS — I2510 Atherosclerotic heart disease of native coronary artery without angina pectoris: Secondary | ICD-10-CM | POA: Diagnosis not present

## 2016-09-10 LAB — CULTURE, BLOOD (ROUTINE X 2)
CULTURE: NO GROWTH
Culture: NO GROWTH
SPECIAL REQUESTS: ADEQUATE
SPECIAL REQUESTS: ADEQUATE

## 2016-09-18 ENCOUNTER — Telehealth (INDEPENDENT_AMBULATORY_CARE_PROVIDER_SITE_OTHER): Payer: Self-pay | Admitting: Orthopaedic Surgery

## 2016-09-18 NOTE — Telephone Encounter (Signed)
Patient's son Kinnie Feil) called asked if he can get a call back concerning his mother. He said she is still in a lot of pain. Carlyle want to know what is Dr. Warren Danes next plan of care for his mother. He said the (PT) does not seem to be helping her. The number to contact Kinnie Feil is [phone]

## 2016-09-19 NOTE — Telephone Encounter (Signed)
See message below  Please advise

## 2016-09-19 NOTE — Telephone Encounter (Signed)
Not my patient.  Dr. Albertine Grates is the doctor

## 2016-09-19 NOTE — Telephone Encounter (Signed)
Called son advised him he called the incorrect Dr Warren Danes office

## 2016-09-23 NOTE — Telephone Encounter (Signed)
Well they selected the wrong Dr. Roda Shutters.

## 2016-09-23 NOTE — Telephone Encounter (Signed)
Patients son called again and states the hospital paperwork said to follow up with you. She has appt scheduled with you  per April and will be considered a new pt.

## 2016-09-24 ENCOUNTER — Other Ambulatory Visit: Payer: Self-pay | Admitting: *Deleted

## 2016-09-24 NOTE — Patient Outreach (Signed)
[facility]) Care Management  09/24/2016  EMELEE RODOCKER 1950-02-21 [id number] Met with SW at facility. She reports patient plan was to go to ALF but has now decided to go home patient needsTKR. She has appointment 10/01/16 with ortho.  Met with patient at bedside of facility. She reports she will have her TKR, rehab then go home with her sister.   At this time no [facility]  care management needs addressed.   Plan to sign off and will re-assess after her pending surgery for [facility] community care management needs.  Royetta Crochet. Laymond Purser, RN, BSN, [place] [phone]) Business Cell  [phone]) Toll Free Office

## 2016-09-24 NOTE — Telephone Encounter (Signed)
Said to follow up with you has appt scheduled

## 2016-10-01 ENCOUNTER — Ambulatory Visit (INDEPENDENT_AMBULATORY_CARE_PROVIDER_SITE_OTHER): Payer: No Typology Code available for payment source | Admitting: Orthopaedic Surgery

## 2016-10-01 ENCOUNTER — Encounter (INDEPENDENT_AMBULATORY_CARE_PROVIDER_SITE_OTHER): Payer: Self-pay | Admitting: Orthopaedic Surgery

## 2016-10-01 DIAGNOSIS — M1712 Unilateral primary osteoarthritis, left knee: Secondary | ICD-10-CM

## 2016-10-01 MED ORDER — LIDOCAINE HCL 1 % IJ SOLN
2.0000 mL | INTRAMUSCULAR | Status: AC | PRN
  Administered 2016-10-01: 2 mL

## 2016-10-01 MED ORDER — BUPIVACAINE HCL 0.5 % IJ SOLN
2.0000 mL | INTRAMUSCULAR | Status: AC | PRN
  Administered 2016-10-01: 2 mL via INTRA_ARTICULAR

## 2016-10-01 MED ORDER — METHYLPREDNISOLONE ACETATE 40 MG/ML IJ SUSP
40.0000 mg | INTRAMUSCULAR | Status: AC | PRN
  Administered 2016-10-01: 40 mg via INTRA_ARTICULAR

## 2016-10-01 NOTE — Progress Notes (Signed)
Office Visit Note   Patient: Savannah Hernandez           Date of Birth: [birth year]           MRN: [id number] Visit Date: 10/01/2016              Requested by: Simone Curia, MD [address] [place], [place] [address] PCP: Simone Curia, MD   Assessment & Plan: Visit Diagnoses:  1. Unilateral primary osteoarthritis, left knee     Plan: Overall impression is severe tricompartmental degenerative joint disease of the left knee. Patient has multiple medical problems and is very low demand and walks with a walker at home and essentially is a household ambulating. The MRI findings were discussed with the patient. We did perform a cortisone injection today to hopefully give her some pain relief. Continue physical therapy. Questions encouraged and answered. follow-up as needed Follow-up as needed..  Follow-Up Instructions: Return if symptoms worsen or fail to improve.   Orders:  No orders of the defined types were placed in this encounter.  No orders of the defined types were placed in this encounter.     Procedures: Large Joint Inj Date/Time: 10/01/2016 11:44 AM Performed by: Tarry Kos Authorized by: Tarry Kos   Consent Given by:  Patient Timeout: prior to procedure the correct patient, procedure, and site was verified   Indications:  Pain Location:  Knee Site:  L knee Prep: patient was prepped and draped in usual sterile fashion   Needle Size:  22 G Ultrasound Guidance: No   Fluoroscopic Guidance: No   Arthrogram: No   Medications:  2 mL lidocaine 1 %; 2 mL bupivacaine 0.5 %; 40 mg methylPREDNISolone acetate 40 MG/ML Patient tolerance:  Patient tolerated the procedure well with no immediate complications     Clinical Data: No additional findings.   Subjective: Chief Complaint  Patient presents with  . Left Knee - Pain    Patient comes in today for acute left knee pain status post injury about a month ago. MRI shows mainly degenerative widespread  changes of her left knee. Her left knee did not actually hit the floor. She slid down on her back. She is currently at a skilled nursing facility for failure to progress with physical therapy due to her recent hospitalization for the left knee pain. Denies any numbness and tingling.    Review of Systems  Constitutional: Negative.   HENT: Negative.   Eyes: Negative.   Respiratory: Negative.   Cardiovascular: Negative.   Endocrine: Negative.   Musculoskeletal: Negative.   Neurological: Negative.   Hematological: Negative.   Psychiatric/Behavioral: Negative.   All other systems reviewed and are negative.    Objective: Vital Signs: There were no vitals taken for this visit.  Physical Exam  Constitutional: She is oriented to person, place, and time. She appears well-developed and well-nourished.  HENT:  Head: Normocephalic and atraumatic.  Eyes: EOM are normal.  Neck: Neck supple.  Pulmonary/Chest: Effort normal.  Abdominal: Soft.  Neurological: She is alert and oriented to person, place, and time.  Skin: Skin is warm. Capillary refill takes less than 2 seconds.  Psychiatric: She has a normal mood and affect. Her behavior is normal. Judgment and thought content normal.  Nursing note and vitals reviewed.   Ortho Exam Left knee exam shows no joint effusion. Collaterals and cruciates are stable. Range of motion is very limited secondary to guarding. No skin lesions. No signs of infection. Specialty  Comments:  No specialty comments available.  Imaging: No results found.   PMFS History: Patient Active Problem List   Diagnosis Date Noted  . Fall 09/03/2016  . CAD (coronary artery disease) 09/03/2016  . Left knee injury 09/03/2016  . Leukocytosis 09/03/2016  . Hypertension   . Hypercholesteremia   . Diabetes mellitus without complication [facility](HCC)    Past Medical History:  Diagnosis Date  . Chronic kidney disease, stage 2 (mild)    stage 2, GFR 60-89 ml/min  .  Hypercholesteremia   . Hypertension   . Incontinence overflow, urine   . Obesity   . Osteoarthritis    "in my joints" (09/04/2016)  . Type II diabetes mellitus (HCC)     Family History  Problem Relation Age of Onset  . Heart attack Mother   . Congestive Heart Failure Mother   . Diabetes Mellitus II Mother     Past Surgical History:  Procedure Laterality Date  . ABDOMINAL HYSTERECTOMY    . CARPAL TUNNEL RELEASE Right   . CATARACT EXTRACTION W/ INTRAOCULAR LENS  IMPLANT, BILATERAL    . CORONARY ANGIOPLASTY WITH STENT PLACEMENT    . DILATION AND CURETTAGE OF UTERUS    . TUBAL LIGATION    . TUMOR EXCISION  1970's   "fatty thing taken off the top of my head; it came back"   Social History   Occupational History  . Not on file.   Social History Main Topics  . Smoking status: Never Smoker  . Smokeless tobacco: Never Used  . Alcohol use No  . Drug use: No  . Sexual activity: No

## 2016-10-01 NOTE — Progress Notes (Deleted)
Office Visit Note   Patient: Savannah HillockKatherine F Dobis           Date of Birth: 1950/07/05           MRN: [id number] Visit Date: 10/01/2016              Requested by: Simone CuriaLee, Keung, MD [id number] [place], [place] [id number] PCP: Simone CuriaLee, Keung, MD   Assessment & Plan: Visit Diagnoses:  1. Unilateral primary osteoarthritis, left knee     Plan: ***  Follow-Up Instructions: Return if symptoms worsen or fail to improve.   Orders:  No orders of the defined types were placed in this encounter.  No orders of the defined types were placed in this encounter.     Procedures: No procedures performed   Clinical Data: No additional findings.   Subjective: Chief Complaint  Patient presents with  . Left Knee - Pain    Patient is a 66 year old female who comes in with acute left knee injury about a month ago. She walks with a walker and is overall low demand at baseline. She is currently at a skilled nursing facility due to recent hospitalization and difficulty with progressing with physical therapy. She denies any numbness and tingling. Her knee did not actually strike the ground. Sounds like she slipped down on her back    Review of Systems  Constitutional: Negative.   HENT: Negative.   Eyes: Negative.   Respiratory: Negative.   Cardiovascular: Negative.   Endocrine: Negative.   Musculoskeletal: Negative.   Neurological: Negative.   Hematological: Negative.   Psychiatric/Behavioral: Negative.   All other systems reviewed and are negative.    Objective: Vital Signs: There were no vitals taken for this visit.  Physical Exam  Constitutional: She is oriented to person, place, and time. She appears well-developed and well-nourished.  HENT:  Head: Normocephalic and atraumatic.  Eyes: EOM are normal.  Neck: Neck supple.  Pulmonary/Chest: Effort normal.  Abdominal: Soft.  Neurological: She is alert and oriented to person, place, and time.  Skin: Skin is warm.  Capillary refill takes less than 2 seconds.  Psychiatric: She has a normal mood and affect. Her behavior is normal. Judgment and thought content normal.  Nursing note and vitals reviewed.   Ortho Exam  Specialty Comments:  No specialty comments available.  Imaging: No results found.   PMFS History: Patient Active Problem List   Diagnosis Date Noted  . Fall 09/03/2016  . CAD (coronary artery disease) 09/03/2016  . Left knee injury 09/03/2016  . Leukocytosis 09/03/2016  . Hypertension   . Hypercholesteremia   . Diabetes mellitus without complication [facility](HCC)    Past Medical History:  Diagnosis Date  . Chronic kidney disease, stage 2 (mild)    stage 2, GFR 60-89 ml/min  . Hypercholesteremia   . Hypertension   . Incontinence overflow, urine   . Obesity   . Osteoarthritis    "in my joints" (09/04/2016)  . Type II diabetes mellitus (HCC)     Family History  Problem Relation Age of Onset  . Heart attack Mother   . Congestive Heart Failure Mother   . Diabetes Mellitus II Mother     Past Surgical History:  Procedure Laterality Date  . ABDOMINAL HYSTERECTOMY    . CARPAL TUNNEL RELEASE Right   . CATARACT EXTRACTION W/ INTRAOCULAR LENS  IMPLANT, BILATERAL    . CORONARY ANGIOPLASTY WITH STENT PLACEMENT    . DILATION AND CURETTAGE OF UTERUS    .  TUBAL LIGATION    . TUMOR EXCISION  1970's   "fatty thing taken off the top of my head; it came back"   Social History   Occupational History  . Not on file.   Social History Main Topics  . Smoking status: Never Smoker  . Smokeless tobacco: Never Used  . Alcohol use No  . Drug use: No  . Sexual activity: No

## 2016-10-07 DIAGNOSIS — S8992XD Unspecified injury of left lower leg, subsequent encounter: Secondary | ICD-10-CM | POA: Diagnosis not present

## 2016-10-07 DIAGNOSIS — M159 Polyosteoarthritis, unspecified: Secondary | ICD-10-CM | POA: Diagnosis not present

## 2016-10-07 DIAGNOSIS — R609 Edema, unspecified: Secondary | ICD-10-CM | POA: Diagnosis not present

## 2016-10-07 DIAGNOSIS — D72829 Elevated white blood cell count, unspecified: Secondary | ICD-10-CM | POA: Diagnosis not present

## 2016-10-07 DIAGNOSIS — E7800 Pure hypercholesterolemia, unspecified: Secondary | ICD-10-CM | POA: Diagnosis not present

## 2016-10-07 DIAGNOSIS — N390 Urinary tract infection, site not specified: Secondary | ICD-10-CM | POA: Diagnosis not present

## 2016-10-07 DIAGNOSIS — Z6841 Body Mass Index (BMI) 40.0 and over, adult: Secondary | ICD-10-CM | POA: Diagnosis not present

## 2016-10-07 DIAGNOSIS — E1121 Type 2 diabetes mellitus with diabetic nephropathy: Secondary | ICD-10-CM | POA: Diagnosis not present

## 2016-10-07 DIAGNOSIS — E1140 Type 2 diabetes mellitus with diabetic neuropathy, unspecified: Secondary | ICD-10-CM | POA: Diagnosis not present

## 2016-10-07 DIAGNOSIS — E119 Type 2 diabetes mellitus without complications: Secondary | ICD-10-CM | POA: Diagnosis not present

## 2016-10-07 DIAGNOSIS — J309 Allergic rhinitis, unspecified: Secondary | ICD-10-CM | POA: Diagnosis not present

## 2016-10-07 DIAGNOSIS — I2510 Atherosclerotic heart disease of native coronary artery without angina pectoris: Secondary | ICD-10-CM | POA: Diagnosis not present

## 2016-10-07 DIAGNOSIS — S0191XD Laceration without foreign body of unspecified part of head, subsequent encounter: Secondary | ICD-10-CM | POA: Diagnosis not present

## 2016-10-07 DIAGNOSIS — I10 Essential (primary) hypertension: Secondary | ICD-10-CM | POA: Diagnosis not present

## 2016-10-07 DIAGNOSIS — R945 Abnormal results of liver function studies: Secondary | ICD-10-CM | POA: Diagnosis not present

## 2016-10-07 DIAGNOSIS — M25562 Pain in left knee: Secondary | ICD-10-CM | POA: Diagnosis not present

## 2016-10-07 DIAGNOSIS — I5033 Acute on chronic diastolic (congestive) heart failure: Secondary | ICD-10-CM | POA: Diagnosis not present

## 2016-10-07 DIAGNOSIS — M1712 Unilateral primary osteoarthritis, left knee: Secondary | ICD-10-CM | POA: Diagnosis not present

## 2016-10-07 DIAGNOSIS — K5900 Constipation, unspecified: Secondary | ICD-10-CM | POA: Diagnosis not present

## 2016-10-07 DIAGNOSIS — R251 Tremor, unspecified: Secondary | ICD-10-CM | POA: Diagnosis not present

## 2016-10-07 DIAGNOSIS — N183 Chronic kidney disease, stage 3 (moderate): Secondary | ICD-10-CM | POA: Diagnosis not present

## 2016-10-16 DIAGNOSIS — I129 Hypertensive chronic kidney disease with stage 1 through stage 4 chronic kidney disease, or unspecified chronic kidney disease: Secondary | ICD-10-CM | POA: Diagnosis not present

## 2016-10-16 DIAGNOSIS — N182 Chronic kidney disease, stage 2 (mild): Secondary | ICD-10-CM | POA: Diagnosis not present

## 2016-10-16 DIAGNOSIS — Z794 Long term (current) use of insulin: Secondary | ICD-10-CM | POA: Diagnosis not present

## 2016-10-16 DIAGNOSIS — Z6841 Body Mass Index (BMI) 40.0 and over, adult: Secondary | ICD-10-CM | POA: Diagnosis not present

## 2016-10-16 DIAGNOSIS — R600 Localized edema: Secondary | ICD-10-CM | POA: Diagnosis not present

## 2016-10-16 DIAGNOSIS — E1142 Type 2 diabetes mellitus with diabetic polyneuropathy: Secondary | ICD-10-CM | POA: Diagnosis not present

## 2016-10-21 ENCOUNTER — Ambulatory Visit (INDEPENDENT_AMBULATORY_CARE_PROVIDER_SITE_OTHER): Payer: Medicare Other | Admitting: Orthopaedic Surgery

## 2016-10-21 DIAGNOSIS — G8929 Other chronic pain: Secondary | ICD-10-CM

## 2016-10-21 DIAGNOSIS — M25561 Pain in right knee: Secondary | ICD-10-CM

## 2016-10-21 MED ORDER — METHYLPREDNISOLONE ACETATE 40 MG/ML IJ SUSP
40.0000 mg | INTRAMUSCULAR | Status: AC | PRN
  Administered 2016-10-21: 40 mg via INTRA_ARTICULAR

## 2016-10-21 MED ORDER — LIDOCAINE HCL 1 % IJ SOLN
2.0000 mL | INTRAMUSCULAR | Status: AC | PRN
  Administered 2016-10-21: 2 mL

## 2016-10-21 MED ORDER — BUPIVACAINE HCL 0.5 % IJ SOLN
2.0000 mL | INTRAMUSCULAR | Status: AC | PRN
  Administered 2016-10-21: 2 mL via INTRA_ARTICULAR

## 2016-10-21 NOTE — Progress Notes (Signed)
Office Visit Note   Patient: Savannah Hernandez           Date of Birth: [birth year]           MRN: [id number] Visit Date: 10/21/2016              Requested by: Simone Curia, MD [address] [place], [place] [address] PCP: Simone Curia, MD   Assessment & Plan: Visit Diagnoses:  1. Chronic pain of right knee     Plan: Patient has chronic right knee pain possibly arthritis. Cortisone injection was performed today. Needs aggressive physical therapy for mobilization. Follow-up as needed. Total face to face encounter time was greater than 25 minutes and over half of this time was spent in counseling and/or coordination of care.  Follow-Up Instructions: Return if symptoms worsen or fail to improve.   Orders:  No orders of the defined types were placed in this encounter.  No orders of the defined types were placed in this encounter.     Procedures: Large Joint Inj Date/Time: 10/21/2016 11:35 AM Performed by: Tarry Kos Authorized by: Tarry Kos   Consent Given by:  Patient Timeout: prior to procedure the correct patient, procedure, and site was verified   Indications:  Pain Location:  Knee Site:  R knee Prep: patient was prepped and draped in usual sterile fashion   Needle Size:  22 G Approach:  Lateral Ultrasound Guidance: No   Fluoroscopic Guidance: No   Arthrogram: No   Medications:  2 mL bupivacaine 0.5 %; 40 mg methylPREDNISolone acetate 40 MG/ML; 2 mL lidocaine 1 % Patient tolerance:  Patient tolerated the procedure well with no immediate complications     Clinical Data: No additional findings.   Subjective: Chief Complaint  Patient presents with  . Left Knee - Pain, Follow-up    Patient comes in today for right knee pain. She has been nonambulatory since July due to a fall. She lives permanently at a rehabilitation center. Her right knee feels similar to her left knee and she is requesting an injection.    Review of Systems    Constitutional: Negative.   HENT: Negative.   Eyes: Negative.   Respiratory: Negative.   Cardiovascular: Negative.   Endocrine: Negative.   Musculoskeletal: Negative.   Neurological: Negative.   Hematological: Negative.   Psychiatric/Behavioral: Negative.   All other systems reviewed and are negative.    Objective: Vital Signs: There were no vitals taken for this visit.  Physical Exam  Constitutional: She is oriented to person, place, and time. She appears well-developed and well-nourished.  Pulmonary/Chest: Effort normal.  Neurological: She is alert and oriented to person, place, and time.  Skin: Skin is warm. Capillary refill takes less than 2 seconds.  Psychiatric: She has a normal mood and affect. Her behavior is normal. Judgment and thought content normal.  Nursing note and vitals reviewed.   Ortho Exam Right knee injection shows no joint effusion. Closing pressures are stable. Minimal effort with physical examination. Specialty Comments:  No specialty comments available.  Imaging: No results found.   PMFS History: Patient Active Problem List   Diagnosis Date Noted  . Chronic pain of right knee 10/21/2016  . Fall 09/03/2016  . CAD (coronary artery disease) 09/03/2016  . Left knee injury 09/03/2016  . Leukocytosis 09/03/2016  . Hypertension   . Hypercholesteremia   . Diabetes mellitus without complication [facility])    Past Medical History:  Diagnosis Date  . Chronic  kidney disease, stage 2 (mild)    stage 2, GFR 60-89 ml/min  . Hypercholesteremia   . Hypertension   . Incontinence overflow, urine   . Obesity   . Osteoarthritis    "in my joints" (09/04/2016)  . Type II diabetes mellitus (HCC)     Family History  Problem Relation Age of Onset  . Heart attack Mother   . Congestive Heart Failure Mother   . Diabetes Mellitus II Mother     Past Surgical History:  Procedure Laterality Date  . ABDOMINAL HYSTERECTOMY    . CARPAL TUNNEL RELEASE Right   .  CATARACT EXTRACTION W/ INTRAOCULAR LENS  IMPLANT, BILATERAL    . CORONARY ANGIOPLASTY WITH STENT PLACEMENT    . DILATION AND CURETTAGE OF UTERUS    . TUBAL LIGATION    . TUMOR EXCISION  1970's   "fatty thing taken off the top of my head; it came back"   Social History   Occupational History  . Not on file.   Social History Main Topics  . Smoking status: Never Smoker  . Smokeless tobacco: Never Used  . Alcohol use No  . Drug use: No  . Sexual activity: No

## 2016-11-08 ENCOUNTER — Emergency Department (HOSPITAL_COMMUNITY)
Admission: EM | Admit: 2016-11-08 | Discharge: 2016-11-08 | Disposition: A | Discharge status: Home / Self Care | Payer: Medicare Other | Attending: Emergency Medicine | Admitting: Emergency Medicine

## 2016-11-08 ENCOUNTER — Emergency Department (HOSPITAL_COMMUNITY): Payer: Medicare Other

## 2016-11-08 ENCOUNTER — Encounter (HOSPITAL_COMMUNITY): Payer: Self-pay | Admitting: Emergency Medicine

## 2016-11-08 DIAGNOSIS — W19XXXA Unspecified fall, initial encounter: Secondary | ICD-10-CM

## 2016-11-08 DIAGNOSIS — T148XXA Other injury of unspecified body region, initial encounter: Secondary | ICD-10-CM | POA: Diagnosis not present

## 2016-11-08 DIAGNOSIS — Z794 Long term (current) use of insulin: Secondary | ICD-10-CM | POA: Insufficient documentation

## 2016-11-08 DIAGNOSIS — S199XXA Unspecified injury of neck, initial encounter: Secondary | ICD-10-CM | POA: Diagnosis not present

## 2016-11-08 DIAGNOSIS — M25512 Pain in left shoulder: Secondary | ICD-10-CM | POA: Diagnosis not present

## 2016-11-08 DIAGNOSIS — M25561 Pain in right knee: Secondary | ICD-10-CM | POA: Diagnosis not present

## 2016-11-08 DIAGNOSIS — I129 Hypertensive chronic kidney disease with stage 1 through stage 4 chronic kidney disease, or unspecified chronic kidney disease: Secondary | ICD-10-CM | POA: Insufficient documentation

## 2016-11-08 DIAGNOSIS — S0990XA Unspecified injury of head, initial encounter: Secondary | ICD-10-CM | POA: Diagnosis not present

## 2016-11-08 DIAGNOSIS — M25571 Pain in right ankle and joints of right foot: Secondary | ICD-10-CM | POA: Diagnosis not present

## 2016-11-08 DIAGNOSIS — N182 Chronic kidney disease, stage 2 (mild): Secondary | ICD-10-CM | POA: Insufficient documentation

## 2016-11-08 DIAGNOSIS — E119 Type 2 diabetes mellitus without complications: Secondary | ICD-10-CM | POA: Diagnosis not present

## 2016-11-08 DIAGNOSIS — S299XXA Unspecified injury of thorax, initial encounter: Secondary | ICD-10-CM | POA: Diagnosis not present

## 2016-11-08 DIAGNOSIS — S3993XA Unspecified injury of pelvis, initial encounter: Secondary | ICD-10-CM | POA: Diagnosis not present

## 2016-11-08 DIAGNOSIS — R51 Headache: Secondary | ICD-10-CM | POA: Insufficient documentation

## 2016-11-08 DIAGNOSIS — S8991XA Unspecified injury of right lower leg, initial encounter: Secondary | ICD-10-CM | POA: Diagnosis not present

## 2016-11-08 DIAGNOSIS — S99912A Unspecified injury of left ankle, initial encounter: Secondary | ICD-10-CM | POA: Diagnosis not present

## 2016-11-08 DIAGNOSIS — S4992XA Unspecified injury of left shoulder and upper arm, initial encounter: Secondary | ICD-10-CM | POA: Diagnosis not present

## 2016-11-08 DIAGNOSIS — R102 Pelvic and perineal pain: Secondary | ICD-10-CM | POA: Diagnosis not present

## 2016-11-08 DIAGNOSIS — R079 Chest pain, unspecified: Secondary | ICD-10-CM | POA: Diagnosis not present

## 2016-11-08 DIAGNOSIS — M25562 Pain in left knee: Secondary | ICD-10-CM | POA: Diagnosis not present

## 2016-11-08 DIAGNOSIS — S8992XA Unspecified injury of left lower leg, initial encounter: Secondary | ICD-10-CM | POA: Diagnosis not present

## 2016-11-08 DIAGNOSIS — S99911A Unspecified injury of right ankle, initial encounter: Secondary | ICD-10-CM | POA: Diagnosis not present

## 2016-11-08 MED ORDER — OXYCODONE-ACETAMINOPHEN 5-325 MG PO TABS
1.0000 | ORAL_TABLET | Freq: Once | ORAL | Status: AC
  Administered 2016-11-08: 1 via ORAL
  Filled 2016-11-08: qty 1

## 2016-11-08 NOTE — ED Provider Notes (Signed)
[facility]-EMERGENCY DEPT Provider Note   CSN: [id number] Arrival date & time: 11/08/16  1558     History   Chief Complaint Chief Complaint  Patient presents with  . Fall    HPI SHALIA BARTKO is a 66 y.o. female.  HPI ICA DAYE is a 66 y.o. female with hx of chronic kidney disease, HTN, DM, severe arthritis, wheelchair bound, presents to ED after a fall. Pt was being rolled in bed to change, and pt rolled off the bed falling on the stomach. Pt states she tried to catch herself on a chair next to the bed, but hit her forehead on wooden edge of the chair and fell down, hitting face first and hitting left chest, left knee that was already in immobilizer, and right ankle. Pt states she had no LOC. She is complaining of pain "everywhere." Specifically pain in the face, head, neck, left clavicle, left chest, back, bilateral hips, bilateral knees, bilateral ankles. Pt is on plavix.   Past Medical History:  Diagnosis Date  . Chronic kidney disease, stage 2 (mild)    stage 2, GFR 60-89 ml/min  . Hypercholesteremia   . Hypertension   . Incontinence overflow, urine   . Obesity   . Osteoarthritis    "in my joints" (09/04/2016)  . Type II diabetes mellitus [facility])     Patient Active Problem List   Diagnosis Date Noted  . Chronic pain of right knee 10/21/2016  . Fall 09/03/2016  . CAD (coronary artery disease) 09/03/2016  . Left knee injury 09/03/2016  . Leukocytosis 09/03/2016  . Hypertension   . Hypercholesteremia   . Diabetes mellitus without complication [facility])     Past Surgical History:  Procedure Laterality Date  . ABDOMINAL HYSTERECTOMY    . CARPAL TUNNEL RELEASE Right   . CATARACT EXTRACTION W/ INTRAOCULAR LENS  IMPLANT, BILATERAL    . CORONARY ANGIOPLASTY WITH STENT PLACEMENT    . DILATION AND CURETTAGE OF UTERUS    . TUBAL LIGATION    . TUMOR EXCISION  1970's   "fatty thing taken off the top of my head; it came back"    OB History    No data  available       Home Medications    Prior to Admission medications   Medication Sig Start Date End Date Taking? Authorizing Provider  aspirin EC 81 MG tablet Take 81 mg by mouth daily.    [provider]  calcium carbonate (OS-CAL - DOSED IN MG OF ELEMENTAL CALCIUM) 1250 (500 Ca) MG tablet Take 2 tablets by mouth daily.    [provider]  carvedilol (COREG) 12.5 MG tablet Take 12.5 mg by mouth 2 (two) times daily with a meal.    [provider]  clopidogrel (PLAVIX) 75 MG tablet Take 75 mg by mouth daily.    [provider]  furosemide (LASIX) 20 MG tablet Take 2 tablets (40 mg total) by mouth 2 (two) times daily. 09/07/16   Albertine Grates, MD  Glucosamine-Chondroit-Vit C-Mn (GLUCOSAMINE CHONDR 500 COMPLEX PO) Take 2,000 mg by mouth daily.    [provider]  HYDROcodone-acetaminophen (NORCO/VICODIN) 5-325 MG tablet Take 1 tablet by mouth every 6 (six) hours as needed for moderate pain. 09/07/16   Albertine Grates, MD  insulin aspart (NOVOLOG FLEXPEN) 100 UNIT/ML FlexPen Before each meal 3 times a day, 140-199 - 2 units, 200-250 - 4 units, 251-299 - 6 units,  300-349 - 8 units,  350 or above 10 units. Insulin  PEN if approved, provide syringes and needles if needed. 09/07/16   Albertine Grates, MD  Insulin Glargine [facility] KWIKPEN) 100 UNIT/ML SOPN Inject 35 Units into the skin 2 (two) times daily.    [provider]  Investigational - Study Medication Take 1 tablet by mouth every morning. Light pink tablet with PT BAY [phone]    [provider]  levocetirizine (XYZAL) 5 MG tablet Take 5 mg by mouth every morning.    [provider]  lisinopril (PRINIVIL,ZESTRIL) 20 MG tablet Take 20 mg by mouth daily.    [provider]  metFORMIN (GLUCOPHAGE-XR) 500 MG 24 hr tablet Take 1,000 mg by mouth 2 (two) times daily.    [provider]  Multiple Vitamin (MULTIVITAMIN WITH MINERALS) TABS tablet Take 1 tablet by mouth daily.     [provider]  Multiple Vitamins-Minerals (PRESERVISION AREDS 2 PO) Take 1 capsule by mouth 2 (two) times daily.    [provider]  pravastatin (PRAVACHOL) 40 MG tablet Take 40 mg by mouth daily.    [provider]  senna-docusate (SENOKOT-S) 8.6-50 MG tablet Take 1 tablet by mouth at bedtime. Hold if diarrhea 09/07/16   Albertine Grates, MD  spironolactone (ALDACTONE) 25 MG tablet Take 25 mg by mouth daily.    [provider]    Family History Family History  Problem Relation Age of Onset  . Heart attack Mother   . Congestive Heart Failure Mother   . Diabetes Mellitus II Mother     Social History Social History  Substance Use Topics  . Smoking status: Never Smoker  . Smokeless tobacco: Never Used  . Alcohol use No     Allergies   Penicillins and Aspirin   Review of Systems Review of Systems  Constitutional: Negative for chills and fever.  Respiratory: Negative for cough, chest tightness and shortness of breath.   Cardiovascular: Negative for chest pain, palpitations and leg swelling.  Gastrointestinal: Negative for abdominal pain, diarrhea, nausea and vomiting.  Genitourinary: Negative for dysuria, flank pain, pelvic pain, vaginal bleeding, vaginal discharge and vaginal pain.  Musculoskeletal: Positive for arthralgias and myalgias. Negative for neck pain and neck stiffness.  Skin: Negative for rash.  Neurological: Positive for headaches. Negative for dizziness and weakness.  All other systems reviewed and are negative.    Physical Exam Updated Vital Signs BP (!) 161/58 (BP Location: Right Arm)   Pulse (!) 56   Temp 98.1 F (36.7 C) (Oral)   Resp 16   SpO2 94%   Physical Exam  Constitutional: She appears well-developed and well-nourished. No distress.  Morbidly obese  HENT:  Head: Normocephalic and atraumatic.  No obvious bruising, swelling, deformity  Eyes: Pupils are equal, round, and reactive to light. Conjunctivae and EOM are  normal.  Neck: Neck supple.  ttp over midline cervical spine  Cardiovascular: Normal rate, regular rhythm and normal heart sounds.   Pulmonary/Chest: Effort normal and breath sounds normal. No respiratory distress. She has no wheezes. She has no rales. She exhibits tenderness.  TTP over left lower ribs.  Abdominal: Soft. Bowel sounds are normal. She exhibits no distension. There is no tenderness. There is no rebound.  Musculoskeletal: She exhibits no edema.  ttp over left clavicle. Full rom of bilateral shoulders, elbows, wrists. TTP over pelvis and bilateral hips. TTP over bilateral knees, ankles. Large bruise to right ankle.   Neurological: She is alert.  Skin: Skin is warm and dry.  Psychiatric: She has a normal mood and  affect. Her behavior is normal.  Nursing note and vitals reviewed.    ED Treatments / Results  Labs (all labs ordered are listed, but only abnormal results are displayed) Labs Reviewed - No data to display  EKG  EKG Interpretation None       Radiology Dg Ribs Unilateral W/chest Left  Result Date: 11/08/2016 CLINICAL DATA:  Status post fall out of bed today.  Left chest pain. EXAM: LEFT RIBS AND CHEST - 3+ VIEW COMPARISON:  PA and lateral chest 09/03/2016. FINDINGS: The lungs are clear. No pneumothorax or pleural effusion. There is cardiomegaly. Aortic atherosclerosis noted no fracture is identified. IMPRESSION: Negative for fracture or other acute abnormality. Cardiomegaly. Electronically Signed   By: Drusilla Kanner M.D.   On: 11/08/2016 19:39   Dg Pelvis 1-2 Views  Result Date: 11/08/2016 CLINICAL DATA:  Larey Seat from nursing home bed face-first onto floor today; has pain in both knees, RIGHT foot/heel pain, LEFT clavicular pain, LEFT lower rib and flank pain, BILATERAL hip pain, lower back pain, initial encounter EXAM: PELVIS - 1-2 VIEW COMPARISON:  Pelvic radiograph 10/03/2010 FINDINGS: Diffuse osseous demineralization. Mild narrowing of the hip joints  bilaterally. SI joints grossly preserved. Subtle cortical irregularity identified at the lateral aspect of the RIGHT superior pubic ramus, not seen previously. No additional fracture, dislocation, or bone destruction. Scattered atherosclerotic calcifications and few pelvic phleboliths. IMPRESSION: Subtle cortical irregularity at lateral margin of the RIGHT superior pubic ramus new since prior exam, unable to exclude subtle nondisplaced fracture. Electronically Signed   By: Ulyses Southward M.D.   On: 11/08/2016 19:43   Dg Clavicle Left  Result Date: 11/08/2016 CLINICAL DATA:  Patient fell from nursing home bed today. Fell onto floor face first. Pain in both knees. right foot/heel pain. Left collar bone pain. Left lower rib flank pain. Pain in both hips at first but mostly just the left hip currently.*comment was truncated* EXAM: LEFT CLAVICLE - 2+ VIEWS COMPARISON:  None. FINDINGS: No evidence of clavicle fracture.  Acromioclavicular joints intact. IMPRESSION: No clavicle fracture Electronically Signed   By: Genevive Bi M.D.   On: 11/08/2016 19:43   Dg Ankle Complete Left  Result Date: 11/08/2016 CLINICAL DATA:  Fall from bed. EXAM: LEFT ANKLE COMPLETE - 3+ VIEW COMPARISON:  None. FINDINGS: There is no evidence of fracture, dislocation, or joint effusion. There is no evidence of arthropathy or other focal bone abnormality. Soft tissues are unremarkable. There are vascular calcifications. IMPRESSION: No fracture or dislocation of the left ankle. Electronically Signed   By: Deatra Robinson M.D.   On: 11/08/2016 19:52   Dg Ankle Complete Right  Result Date: 11/08/2016 CLINICAL DATA:  Larey Seat from nursing home bed face-first onto floor today; has pain in both knees, RIGHT foot/heel pain, LEFT clavicular pain, LEFT lower rib and flank pain, BILATERAL hip pain, lower back pain, initial encounter EXAM: RIGHT ANKLE - COMPLETE 3+ VIEW COMPARISON:  04/13/2015 FINDINGS: Osseous demineralization. Joint spaces  preserved. Scattered soft tissue swelling especially medially. No acute fracture, dislocation, or bone destruction. Small plantar and Achilles insertion calcaneal spurs. IMPRESSION: No definite acute osseous abnormalities. Electronically Signed   By: Ulyses Southward M.D.   On: 11/08/2016 19:44   Ct Head Wo Contrast  Result Date: 11/08/2016 CLINICAL DATA:  Status post fall out of bed today with a blow to the face. Initial encounter. EXAM: CT HEAD WITHOUT CONTRAST CT CERVICAL SPINE WITHOUT CONTRAST TECHNIQUE: Multidetector CT imaging of the head and cervical spine was performed following the  standard protocol without intravenous contrast. Multiplanar CT image reconstructions of the cervical spine were also generated. COMPARISON:  Head and cervical spine CT scans 09/03/2016. FINDINGS: CT HEAD FINDINGS Brain: Mild chronic microvascular ischemic change is again seen. 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 No evidence of acute abnormality hemorrhage, infarct, mass lesion, mass effect, midline shift or abnormal extra-axial fluid collection. No hydrocephalus or pneumocephalus. Vascular: Atherosclerosis noted. Skull: Intact. Sinuses/Orbits: Negative. Other: None. CT CERVICAL SPINE FINDINGS Alignment: Maintained. Skull base and vertebrae: No acute fracture. No primary bone lesion or focal pathologic process. Soft tissues and spinal canal: No prevertebral fluid or swelling. No visible canal hematoma. Disc levels: Loss of disc space height is most notable at C6-7. Facet degenerative disease appears worst on the left at C7-T1. Upper chest: Lung apices are clear. Other: None. IMPRESSION: No acute abnormality head or cervical spine. Stable compared prior exam. Electronically Signed   By: Drusilla Kanner M.D.   On: 11/08/2016 18:30   Ct Cervical Spine Wo Contrast  Result Date: 11/08/2016 CLINICAL DATA:  Status post fall out of bed today with a blow to the face. Initial encounter.  EXAM: CT HEAD WITHOUT CONTRAST CT CERVICAL SPINE WITHOUT CONTRAST TECHNIQUE: Multidetector CT imaging of the head and cervical spine was performed following the standard protocol without intravenous contrast. Multiplanar CT image reconstructions of the cervical spine were also generated. COMPARISON:  Head and cervical spine CT scans 09/03/2016. FINDINGS: CT HEAD FINDINGS Brain: Mild chronic microvascular ischemic change is again seen. 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 No evidence of acute abnormality hemorrhage, infarct, mass lesion, mass effect, midline shift or abnormal extra-axial fluid collection. No hydrocephalus or pneumocephalus. Vascular: Atherosclerosis noted. Skull: Intact. Sinuses/Orbits: Negative. Other: None. CT CERVICAL SPINE FINDINGS Alignment: Maintained. Skull base and vertebrae: No acute fracture. No primary bone lesion or focal pathologic process. Soft tissues and spinal canal: No prevertebral fluid or swelling. No visible canal hematoma. Disc levels: Loss of disc space height is most notable at C6-7. Facet degenerative disease appears worst on the left at C7-T1. Upper chest: Lung apices are clear. Other: None. IMPRESSION: No acute abnormality head or cervical spine. Stable compared prior exam. Electronically Signed   By: Drusilla Kanner M.D.   On: 11/08/2016 18:30   Ct Pelvis Wo Contrast  Result Date: 11/08/2016 CLINICAL DATA:  The patient fell out of bed today. Possible pelvic fracture by plain films. Initial encounter. EXAM: CT PELVIS WITHOUT CONTRAST TECHNIQUE: Multidetector CT imaging of the pelvis was performed following the standard protocol without intravenous contrast. COMPARISON:  Plain films of the pelvis 11/08/2016. FINDINGS: There is no fracture. In particular, there is no fracture of the high right superior pubic ramus as questioned on prior plain films. The hips are located. Mild degenerative change is seen about the hips and at  L4-5 and L5-S1. No focal bony lesion. No avascular necrosis of the femoral heads. Soft tissue structures demonstrate stranding in fat over the left greater trochanter which may be due to contusion. No focal fluid collection is identified. Imaged musculature appears intact. Imaged intrapelvic contents demonstrate postoperative change of hysterectomy. Atherosclerosis noted. IMPRESSION: Negative for acute bony or joint abnormality.  No fracture. Possible soft tissue contusion in the subcutaneous fatty tissues over the left greater trochanter. Atherosclerosis. Electronically Signed   By: Drusilla Kanner M.D.   On: 11/08/2016 20:34   Dg Knee Left Port  Result Date: 11/08/2016 CLINICAL DATA:  Larey Seat  from nursing home bed face-first onto floor today; has pain in both knees, RIGHT foot/heel pain, LEFT clavicular pain, LEFT lower rib and flank pain, BILATERAL hip pain, lower back pain, initial encounter EXAM: PORTABLE LEFT KNEE - 1-2 VIEW COMPARISON:  Portable exam at 1852 hrs compared to 09/03/2016 FINDINGS: Osseous demineralization. Tricompartmental joint space narrowing and spur formation. No acute fracture, dislocation, or bone destruction. No knee joint effusion. Scattered atherosclerotic calcifications and soft tissue swelling. IMPRESSION: Osseous demineralization with tricompartmental osteoarthritic changes. No acute bony abnormalities. Electronically Signed   By: Ulyses Southward M.D.   On: 11/08/2016 19:47   Dg Knee Right Port  Result Date: 11/08/2016 CLINICAL DATA:  Larey Seat from nursing home bed face-first onto floor today; has pain in both knees, RIGHT foot/heel pain, LEFT clavicular pain, LEFT lower rib and flank pain, BILATERAL hip pain, lower back pain, initial encounter EXAM: PORTABLE RIGHT KNEE - 1-2 VIEW COMPARISON:  Portable exam 1852 hours compared to 09/03/2016 FINDINGS: Diffuse osseous demineralization. Diffuse joint space narrowing and minimal spur formation. Old corticated fragment adjacent the  lateral femoral condyle, stable. No acute fracture, dislocation, or bone destruction. No knee joint effusion. Extensive atherosclerotic calcifications. IMPRESSION: Osseous demineralization with degenerative changes RIGHT knee. No definite acute bony abnormalities. Electronically Signed   By: Ulyses Southward M.D.   On: 11/08/2016 19:46    Procedures Procedures (including critical care time)  Medications Ordered in ED Medications - No data to display   Initial Impression / Assessment and Plan / ED Course  I have reviewed the triage vital signs and the nursing notes.  Pertinent labs & imaging results that were available during my care of the patient were reviewed by me and considered in my medical decision making (see chart for details).     Patient in emergency department after mechanical fall off of the bed. She is complaining of pain everywhere. No signs of injuries. Will get CT head and cervical spine given on Plavix and complaining of pain. Will get x-rays of the clavicle, ribs, hips, knees and ankles.   Possible pubic rami fracture. Will get CT pelvis with further evaluation.   CT is negative. Patient's pain improved. Discussed with Dr. Patria Mane who has seen patient as well. Is stable for discharge home, continue wheelchair, continue her regular pain medications, follow-up with family doctor as needed.  Vitals:   11/08/16 1930 11/08/16 1945 11/08/16 2000 11/08/16 2134  BP: (!) 156/60 (!) 141/109 (!) 160/58 (!) 161/64  Pulse: (!) 51 (!) 55 (!) 56 60  Resp:    18  Temp:      TempSrc:      SpO2: 98% 97% 97% 99%     Final Clinical Impressions(s) / ED Diagnoses   Final diagnoses:  Fall, initial encounter    New Prescriptions Discharge Medication List as of 11/08/2016  8:54 PM       Jaynie Crumble, PA-C 11/09/16 Heinz Knuckles, MD 11/09/16 [id number]

## 2016-11-08 NOTE — ED Notes (Signed)
Called Report to [facility].

## 2016-11-08 NOTE — ED Notes (Signed)
Pt to x-ray and CT at this time.

## 2016-11-08 NOTE — ED Triage Notes (Signed)
Pt to ED via [facility] from [facility] after reported falling out of bed while being changed.  Pt st's she hit her face and forehead on a side table before landing on the floor.  Pt c/o pain to forehead, right ankle, bil knees, and right flank pain  Pt denies LOC  Pt was given Fentanyl PTA with relief

## 2016-11-13 DIAGNOSIS — M25562 Pain in left knee: Secondary | ICD-10-CM | POA: Diagnosis not present

## 2016-11-13 DIAGNOSIS — M6281 Muscle weakness (generalized): Secondary | ICD-10-CM | POA: Diagnosis not present

## 2016-11-13 DIAGNOSIS — R2681 Unsteadiness on feet: Secondary | ICD-10-CM | POA: Diagnosis not present

## 2016-11-13 DIAGNOSIS — S8992XD Unspecified injury of left lower leg, subsequent encounter: Secondary | ICD-10-CM | POA: Diagnosis not present

## 2016-11-13 DIAGNOSIS — R278 Other lack of coordination: Secondary | ICD-10-CM | POA: Diagnosis not present

## 2016-11-14 DIAGNOSIS — M25562 Pain in left knee: Secondary | ICD-10-CM | POA: Diagnosis not present

## 2016-11-14 DIAGNOSIS — R278 Other lack of coordination: Secondary | ICD-10-CM | POA: Diagnosis not present

## 2016-11-14 DIAGNOSIS — S8992XD Unspecified injury of left lower leg, subsequent encounter: Secondary | ICD-10-CM | POA: Diagnosis not present

## 2016-11-14 DIAGNOSIS — M6281 Muscle weakness (generalized): Secondary | ICD-10-CM | POA: Diagnosis not present

## 2016-11-14 DIAGNOSIS — R2681 Unsteadiness on feet: Secondary | ICD-10-CM | POA: Diagnosis not present

## 2016-11-19 DIAGNOSIS — M25562 Pain in left knee: Secondary | ICD-10-CM | POA: Diagnosis not present

## 2016-11-19 DIAGNOSIS — M6281 Muscle weakness (generalized): Secondary | ICD-10-CM | POA: Diagnosis not present

## 2016-11-19 DIAGNOSIS — S8992XD Unspecified injury of left lower leg, subsequent encounter: Secondary | ICD-10-CM | POA: Diagnosis not present

## 2016-11-19 DIAGNOSIS — R2681 Unsteadiness on feet: Secondary | ICD-10-CM | POA: Diagnosis not present

## 2016-11-19 DIAGNOSIS — R278 Other lack of coordination: Secondary | ICD-10-CM | POA: Diagnosis not present

## 2016-11-22 DIAGNOSIS — S8992XD Unspecified injury of left lower leg, subsequent encounter: Secondary | ICD-10-CM | POA: Diagnosis not present

## 2016-11-22 DIAGNOSIS — R2681 Unsteadiness on feet: Secondary | ICD-10-CM | POA: Diagnosis not present

## 2016-11-22 DIAGNOSIS — R278 Other lack of coordination: Secondary | ICD-10-CM | POA: Diagnosis not present

## 2016-11-22 DIAGNOSIS — M25562 Pain in left knee: Secondary | ICD-10-CM | POA: Diagnosis not present

## 2016-11-22 DIAGNOSIS — M6281 Muscle weakness (generalized): Secondary | ICD-10-CM | POA: Diagnosis not present

## 2016-11-25 DIAGNOSIS — M6281 Muscle weakness (generalized): Secondary | ICD-10-CM | POA: Diagnosis not present

## 2016-11-25 DIAGNOSIS — R2681 Unsteadiness on feet: Secondary | ICD-10-CM | POA: Diagnosis not present

## 2016-11-25 DIAGNOSIS — S8992XD Unspecified injury of left lower leg, subsequent encounter: Secondary | ICD-10-CM | POA: Diagnosis not present

## 2016-11-25 DIAGNOSIS — R278 Other lack of coordination: Secondary | ICD-10-CM | POA: Diagnosis not present

## 2016-11-25 DIAGNOSIS — M25562 Pain in left knee: Secondary | ICD-10-CM | POA: Diagnosis not present

## 2016-11-26 DIAGNOSIS — S8992XD Unspecified injury of left lower leg, subsequent encounter: Secondary | ICD-10-CM | POA: Diagnosis not present

## 2016-11-26 DIAGNOSIS — M6281 Muscle weakness (generalized): Secondary | ICD-10-CM | POA: Diagnosis not present

## 2016-11-26 DIAGNOSIS — M25562 Pain in left knee: Secondary | ICD-10-CM | POA: Diagnosis not present

## 2016-11-26 DIAGNOSIS — R2681 Unsteadiness on feet: Secondary | ICD-10-CM | POA: Diagnosis not present

## 2016-11-26 DIAGNOSIS — R278 Other lack of coordination: Secondary | ICD-10-CM | POA: Diagnosis not present

## 2016-11-27 DIAGNOSIS — M25562 Pain in left knee: Secondary | ICD-10-CM | POA: Diagnosis not present

## 2016-11-27 DIAGNOSIS — M6281 Muscle weakness (generalized): Secondary | ICD-10-CM | POA: Diagnosis not present

## 2016-11-27 DIAGNOSIS — R2681 Unsteadiness on feet: Secondary | ICD-10-CM | POA: Diagnosis not present

## 2016-11-27 DIAGNOSIS — S8992XD Unspecified injury of left lower leg, subsequent encounter: Secondary | ICD-10-CM | POA: Diagnosis not present

## 2016-11-27 DIAGNOSIS — R278 Other lack of coordination: Secondary | ICD-10-CM | POA: Diagnosis not present

## 2016-11-28 DIAGNOSIS — S8992XD Unspecified injury of left lower leg, subsequent encounter: Secondary | ICD-10-CM | POA: Diagnosis not present

## 2016-11-28 DIAGNOSIS — M25562 Pain in left knee: Secondary | ICD-10-CM | POA: Diagnosis not present

## 2016-11-28 DIAGNOSIS — M6281 Muscle weakness (generalized): Secondary | ICD-10-CM | POA: Diagnosis not present

## 2016-11-28 DIAGNOSIS — R2681 Unsteadiness on feet: Secondary | ICD-10-CM | POA: Diagnosis not present

## 2016-11-28 DIAGNOSIS — R278 Other lack of coordination: Secondary | ICD-10-CM | POA: Diagnosis not present

## 2016-11-29 DIAGNOSIS — E782 Mixed hyperlipidemia: Secondary | ICD-10-CM | POA: Diagnosis not present

## 2016-11-29 DIAGNOSIS — B351 Tinea unguium: Secondary | ICD-10-CM | POA: Diagnosis not present

## 2016-11-29 DIAGNOSIS — E1140 Type 2 diabetes mellitus with diabetic neuropathy, unspecified: Secondary | ICD-10-CM | POA: Diagnosis not present

## 2016-11-29 DIAGNOSIS — I10 Essential (primary) hypertension: Secondary | ICD-10-CM | POA: Diagnosis not present

## 2016-11-29 DIAGNOSIS — L853 Xerosis cutis: Secondary | ICD-10-CM | POA: Diagnosis not present

## 2016-11-29 DIAGNOSIS — E119 Type 2 diabetes mellitus without complications: Secondary | ICD-10-CM | POA: Diagnosis not present

## 2016-11-29 DIAGNOSIS — L603 Nail dystrophy: Secondary | ICD-10-CM | POA: Diagnosis not present

## 2016-12-03 DIAGNOSIS — R278 Other lack of coordination: Secondary | ICD-10-CM | POA: Diagnosis not present

## 2016-12-03 DIAGNOSIS — M25562 Pain in left knee: Secondary | ICD-10-CM | POA: Diagnosis not present

## 2016-12-03 DIAGNOSIS — L89312 Pressure ulcer of right buttock, stage 2: Secondary | ICD-10-CM | POA: Diagnosis not present

## 2016-12-03 DIAGNOSIS — S8992XD Unspecified injury of left lower leg, subsequent encounter: Secondary | ICD-10-CM | POA: Diagnosis not present

## 2016-12-03 DIAGNOSIS — M6281 Muscle weakness (generalized): Secondary | ICD-10-CM | POA: Diagnosis not present

## 2016-12-03 DIAGNOSIS — R2681 Unsteadiness on feet: Secondary | ICD-10-CM | POA: Diagnosis not present

## 2016-12-04 DIAGNOSIS — S8992XD Unspecified injury of left lower leg, subsequent encounter: Secondary | ICD-10-CM | POA: Diagnosis not present

## 2016-12-04 DIAGNOSIS — R278 Other lack of coordination: Secondary | ICD-10-CM | POA: Diagnosis not present

## 2016-12-04 DIAGNOSIS — M25562 Pain in left knee: Secondary | ICD-10-CM | POA: Diagnosis not present

## 2016-12-04 DIAGNOSIS — M6281 Muscle weakness (generalized): Secondary | ICD-10-CM | POA: Diagnosis not present

## 2016-12-04 DIAGNOSIS — R2681 Unsteadiness on feet: Secondary | ICD-10-CM | POA: Diagnosis not present

## 2016-12-06 DIAGNOSIS — M6281 Muscle weakness (generalized): Secondary | ICD-10-CM | POA: Diagnosis not present

## 2016-12-06 DIAGNOSIS — M25562 Pain in left knee: Secondary | ICD-10-CM | POA: Diagnosis not present

## 2016-12-06 DIAGNOSIS — R278 Other lack of coordination: Secondary | ICD-10-CM | POA: Diagnosis not present

## 2016-12-06 DIAGNOSIS — R2681 Unsteadiness on feet: Secondary | ICD-10-CM | POA: Diagnosis not present

## 2016-12-06 DIAGNOSIS — S8992XD Unspecified injury of left lower leg, subsequent encounter: Secondary | ICD-10-CM | POA: Diagnosis not present

## 2016-12-10 DIAGNOSIS — R2681 Unsteadiness on feet: Secondary | ICD-10-CM | POA: Diagnosis not present

## 2016-12-10 DIAGNOSIS — R278 Other lack of coordination: Secondary | ICD-10-CM | POA: Diagnosis not present

## 2016-12-10 DIAGNOSIS — S8992XD Unspecified injury of left lower leg, subsequent encounter: Secondary | ICD-10-CM | POA: Diagnosis not present

## 2016-12-10 DIAGNOSIS — M6281 Muscle weakness (generalized): Secondary | ICD-10-CM | POA: Diagnosis not present

## 2016-12-10 DIAGNOSIS — M25562 Pain in left knee: Secondary | ICD-10-CM | POA: Diagnosis not present

## 2016-12-11 DIAGNOSIS — M6281 Muscle weakness (generalized): Secondary | ICD-10-CM | POA: Diagnosis not present

## 2016-12-11 DIAGNOSIS — R278 Other lack of coordination: Secondary | ICD-10-CM | POA: Diagnosis not present

## 2016-12-11 DIAGNOSIS — M25562 Pain in left knee: Secondary | ICD-10-CM | POA: Diagnosis not present

## 2016-12-11 DIAGNOSIS — S8992XD Unspecified injury of left lower leg, subsequent encounter: Secondary | ICD-10-CM | POA: Diagnosis not present

## 2016-12-11 DIAGNOSIS — R2681 Unsteadiness on feet: Secondary | ICD-10-CM | POA: Diagnosis not present

## 2016-12-12 DIAGNOSIS — E119 Type 2 diabetes mellitus without complications: Secondary | ICD-10-CM | POA: Diagnosis not present

## 2016-12-12 DIAGNOSIS — I2510 Atherosclerotic heart disease of native coronary artery without angina pectoris: Secondary | ICD-10-CM | POA: Diagnosis not present

## 2016-12-12 DIAGNOSIS — R600 Localized edema: Secondary | ICD-10-CM | POA: Diagnosis not present

## 2016-12-16 DIAGNOSIS — R278 Other lack of coordination: Secondary | ICD-10-CM | POA: Diagnosis not present

## 2016-12-16 DIAGNOSIS — M6281 Muscle weakness (generalized): Secondary | ICD-10-CM | POA: Diagnosis not present

## 2016-12-16 DIAGNOSIS — S8992XD Unspecified injury of left lower leg, subsequent encounter: Secondary | ICD-10-CM | POA: Diagnosis not present

## 2016-12-16 DIAGNOSIS — L89312 Pressure ulcer of right buttock, stage 2: Secondary | ICD-10-CM | POA: Diagnosis not present

## 2016-12-16 DIAGNOSIS — R2681 Unsteadiness on feet: Secondary | ICD-10-CM | POA: Diagnosis not present

## 2016-12-16 DIAGNOSIS — M25562 Pain in left knee: Secondary | ICD-10-CM | POA: Diagnosis not present

## 2016-12-17 DIAGNOSIS — S8992XD Unspecified injury of left lower leg, subsequent encounter: Secondary | ICD-10-CM | POA: Diagnosis not present

## 2016-12-17 DIAGNOSIS — M25562 Pain in left knee: Secondary | ICD-10-CM | POA: Diagnosis not present

## 2016-12-17 DIAGNOSIS — R278 Other lack of coordination: Secondary | ICD-10-CM | POA: Diagnosis not present

## 2016-12-17 DIAGNOSIS — M6281 Muscle weakness (generalized): Secondary | ICD-10-CM | POA: Diagnosis not present

## 2016-12-17 DIAGNOSIS — R2681 Unsteadiness on feet: Secondary | ICD-10-CM | POA: Diagnosis not present

## 2016-12-18 DIAGNOSIS — M25562 Pain in left knee: Secondary | ICD-10-CM | POA: Diagnosis not present

## 2016-12-18 DIAGNOSIS — S8992XD Unspecified injury of left lower leg, subsequent encounter: Secondary | ICD-10-CM | POA: Diagnosis not present

## 2016-12-18 DIAGNOSIS — M6281 Muscle weakness (generalized): Secondary | ICD-10-CM | POA: Diagnosis not present

## 2016-12-18 DIAGNOSIS — R2681 Unsteadiness on feet: Secondary | ICD-10-CM | POA: Diagnosis not present

## 2016-12-18 DIAGNOSIS — R278 Other lack of coordination: Secondary | ICD-10-CM | POA: Diagnosis not present

## 2016-12-20 DIAGNOSIS — R0602 Shortness of breath: Secondary | ICD-10-CM | POA: Diagnosis not present

## 2016-12-20 DIAGNOSIS — D649 Anemia, unspecified: Secondary | ICD-10-CM | POA: Diagnosis not present

## 2016-12-20 DIAGNOSIS — I10 Essential (primary) hypertension: Secondary | ICD-10-CM | POA: Diagnosis not present

## 2016-12-20 DIAGNOSIS — R0689 Other abnormalities of breathing: Secondary | ICD-10-CM | POA: Diagnosis not present

## 2016-12-21 DIAGNOSIS — R600 Localized edema: Secondary | ICD-10-CM | POA: Diagnosis not present

## 2016-12-23 DIAGNOSIS — M25562 Pain in left knee: Secondary | ICD-10-CM | POA: Diagnosis not present

## 2016-12-23 DIAGNOSIS — M6281 Muscle weakness (generalized): Secondary | ICD-10-CM | POA: Diagnosis not present

## 2016-12-23 DIAGNOSIS — R2681 Unsteadiness on feet: Secondary | ICD-10-CM | POA: Diagnosis not present

## 2016-12-23 DIAGNOSIS — I10 Essential (primary) hypertension: Secondary | ICD-10-CM | POA: Diagnosis not present

## 2016-12-23 DIAGNOSIS — S8992XD Unspecified injury of left lower leg, subsequent encounter: Secondary | ICD-10-CM | POA: Diagnosis not present

## 2016-12-23 DIAGNOSIS — D649 Anemia, unspecified: Secondary | ICD-10-CM | POA: Diagnosis not present

## 2016-12-23 DIAGNOSIS — R278 Other lack of coordination: Secondary | ICD-10-CM | POA: Diagnosis not present

## 2016-12-24 DIAGNOSIS — R2681 Unsteadiness on feet: Secondary | ICD-10-CM | POA: Diagnosis not present

## 2016-12-24 DIAGNOSIS — M6281 Muscle weakness (generalized): Secondary | ICD-10-CM | POA: Diagnosis not present

## 2016-12-24 DIAGNOSIS — R278 Other lack of coordination: Secondary | ICD-10-CM | POA: Diagnosis not present

## 2016-12-24 DIAGNOSIS — M25562 Pain in left knee: Secondary | ICD-10-CM | POA: Diagnosis not present

## 2016-12-24 DIAGNOSIS — S8992XD Unspecified injury of left lower leg, subsequent encounter: Secondary | ICD-10-CM | POA: Diagnosis not present

## 2016-12-25 DIAGNOSIS — R278 Other lack of coordination: Secondary | ICD-10-CM | POA: Diagnosis not present

## 2016-12-25 DIAGNOSIS — R2681 Unsteadiness on feet: Secondary | ICD-10-CM | POA: Diagnosis not present

## 2016-12-25 DIAGNOSIS — S8992XD Unspecified injury of left lower leg, subsequent encounter: Secondary | ICD-10-CM | POA: Diagnosis not present

## 2016-12-25 DIAGNOSIS — M6281 Muscle weakness (generalized): Secondary | ICD-10-CM | POA: Diagnosis not present

## 2016-12-25 DIAGNOSIS — M25562 Pain in left knee: Secondary | ICD-10-CM | POA: Diagnosis not present

## 2016-12-26 DIAGNOSIS — R278 Other lack of coordination: Secondary | ICD-10-CM | POA: Diagnosis not present

## 2016-12-26 DIAGNOSIS — M25562 Pain in left knee: Secondary | ICD-10-CM | POA: Diagnosis not present

## 2016-12-26 DIAGNOSIS — R2681 Unsteadiness on feet: Secondary | ICD-10-CM | POA: Diagnosis not present

## 2016-12-26 DIAGNOSIS — S8992XD Unspecified injury of left lower leg, subsequent encounter: Secondary | ICD-10-CM | POA: Diagnosis not present

## 2016-12-26 DIAGNOSIS — M6281 Muscle weakness (generalized): Secondary | ICD-10-CM | POA: Diagnosis not present

## 2016-12-27 DIAGNOSIS — M6281 Muscle weakness (generalized): Secondary | ICD-10-CM | POA: Diagnosis not present

## 2016-12-27 DIAGNOSIS — R2681 Unsteadiness on feet: Secondary | ICD-10-CM | POA: Diagnosis not present

## 2016-12-27 DIAGNOSIS — S8992XD Unspecified injury of left lower leg, subsequent encounter: Secondary | ICD-10-CM | POA: Diagnosis not present

## 2016-12-27 DIAGNOSIS — M25562 Pain in left knee: Secondary | ICD-10-CM | POA: Diagnosis not present

## 2016-12-27 DIAGNOSIS — R278 Other lack of coordination: Secondary | ICD-10-CM | POA: Diagnosis not present

## 2016-12-30 DIAGNOSIS — I10 Essential (primary) hypertension: Secondary | ICD-10-CM | POA: Diagnosis not present

## 2016-12-30 DIAGNOSIS — D649 Anemia, unspecified: Secondary | ICD-10-CM | POA: Diagnosis not present

## 2016-12-31 DIAGNOSIS — R278 Other lack of coordination: Secondary | ICD-10-CM | POA: Diagnosis not present

## 2016-12-31 DIAGNOSIS — M6281 Muscle weakness (generalized): Secondary | ICD-10-CM | POA: Diagnosis not present

## 2016-12-31 DIAGNOSIS — R2681 Unsteadiness on feet: Secondary | ICD-10-CM | POA: Diagnosis not present

## 2017-01-01 DIAGNOSIS — R278 Other lack of coordination: Secondary | ICD-10-CM | POA: Diagnosis not present

## 2017-01-01 DIAGNOSIS — R2681 Unsteadiness on feet: Secondary | ICD-10-CM | POA: Diagnosis not present

## 2017-01-01 DIAGNOSIS — M6281 Muscle weakness (generalized): Secondary | ICD-10-CM | POA: Diagnosis not present

## 2017-01-03 DIAGNOSIS — M6281 Muscle weakness (generalized): Secondary | ICD-10-CM | POA: Diagnosis not present

## 2017-01-03 DIAGNOSIS — R2681 Unsteadiness on feet: Secondary | ICD-10-CM | POA: Diagnosis not present

## 2017-01-03 DIAGNOSIS — R278 Other lack of coordination: Secondary | ICD-10-CM | POA: Diagnosis not present

## 2017-01-07 DIAGNOSIS — M6281 Muscle weakness (generalized): Secondary | ICD-10-CM | POA: Diagnosis not present

## 2017-01-07 DIAGNOSIS — R2681 Unsteadiness on feet: Secondary | ICD-10-CM | POA: Diagnosis not present

## 2017-01-07 DIAGNOSIS — R278 Other lack of coordination: Secondary | ICD-10-CM | POA: Diagnosis not present

## 2017-01-08 DIAGNOSIS — M6281 Muscle weakness (generalized): Secondary | ICD-10-CM | POA: Diagnosis not present

## 2017-01-08 DIAGNOSIS — R278 Other lack of coordination: Secondary | ICD-10-CM | POA: Diagnosis not present

## 2017-01-08 DIAGNOSIS — R2681 Unsteadiness on feet: Secondary | ICD-10-CM | POA: Diagnosis not present

## 2017-01-09 DIAGNOSIS — R600 Localized edema: Secondary | ICD-10-CM | POA: Diagnosis not present

## 2017-01-09 DIAGNOSIS — E119 Type 2 diabetes mellitus without complications: Secondary | ICD-10-CM | POA: Diagnosis not present

## 2017-01-10 DIAGNOSIS — M6281 Muscle weakness (generalized): Secondary | ICD-10-CM | POA: Diagnosis not present

## 2017-01-10 DIAGNOSIS — R2681 Unsteadiness on feet: Secondary | ICD-10-CM | POA: Diagnosis not present

## 2017-01-10 DIAGNOSIS — R278 Other lack of coordination: Secondary | ICD-10-CM | POA: Diagnosis not present

## 2017-01-13 DIAGNOSIS — M6281 Muscle weakness (generalized): Secondary | ICD-10-CM | POA: Diagnosis not present

## 2017-01-13 DIAGNOSIS — R2681 Unsteadiness on feet: Secondary | ICD-10-CM | POA: Diagnosis not present

## 2017-01-13 DIAGNOSIS — R278 Other lack of coordination: Secondary | ICD-10-CM | POA: Diagnosis not present

## 2017-01-15 DIAGNOSIS — R2681 Unsteadiness on feet: Secondary | ICD-10-CM | POA: Diagnosis not present

## 2017-01-15 DIAGNOSIS — R278 Other lack of coordination: Secondary | ICD-10-CM | POA: Diagnosis not present

## 2017-01-15 DIAGNOSIS — Z6841 Body Mass Index (BMI) 40.0 and over, adult: Secondary | ICD-10-CM | POA: Diagnosis not present

## 2017-01-15 DIAGNOSIS — M6281 Muscle weakness (generalized): Secondary | ICD-10-CM | POA: Diagnosis not present

## 2017-01-15 DIAGNOSIS — E1122 Type 2 diabetes mellitus with diabetic chronic kidney disease: Secondary | ICD-10-CM | POA: Diagnosis not present

## 2017-01-15 DIAGNOSIS — N182 Chronic kidney disease, stage 2 (mild): Secondary | ICD-10-CM | POA: Diagnosis not present

## 2017-01-15 DIAGNOSIS — Z794 Long term (current) use of insulin: Secondary | ICD-10-CM | POA: Diagnosis not present

## 2017-01-15 DIAGNOSIS — E1142 Type 2 diabetes mellitus with diabetic polyneuropathy: Secondary | ICD-10-CM | POA: Diagnosis not present

## 2017-01-15 DIAGNOSIS — I129 Hypertensive chronic kidney disease with stage 1 through stage 4 chronic kidney disease, or unspecified chronic kidney disease: Secondary | ICD-10-CM | POA: Diagnosis not present

## 2017-01-15 DIAGNOSIS — R600 Localized edema: Secondary | ICD-10-CM | POA: Diagnosis not present

## 2017-01-16 DIAGNOSIS — D649 Anemia, unspecified: Secondary | ICD-10-CM | POA: Diagnosis not present

## 2017-01-16 DIAGNOSIS — I5033 Acute on chronic diastolic (congestive) heart failure: Secondary | ICD-10-CM | POA: Diagnosis not present

## 2017-01-16 DIAGNOSIS — R609 Edema, unspecified: Secondary | ICD-10-CM | POA: Diagnosis not present

## 2017-01-16 DIAGNOSIS — R0602 Shortness of breath: Secondary | ICD-10-CM | POA: Diagnosis not present

## 2017-01-17 DIAGNOSIS — R278 Other lack of coordination: Secondary | ICD-10-CM | POA: Diagnosis not present

## 2017-01-17 DIAGNOSIS — I2510 Atherosclerotic heart disease of native coronary artery without angina pectoris: Secondary | ICD-10-CM | POA: Diagnosis not present

## 2017-01-17 DIAGNOSIS — R609 Edema, unspecified: Secondary | ICD-10-CM | POA: Diagnosis not present

## 2017-01-17 DIAGNOSIS — R945 Abnormal results of liver function studies: Secondary | ICD-10-CM | POA: Diagnosis not present

## 2017-01-17 DIAGNOSIS — M159 Polyosteoarthritis, unspecified: Secondary | ICD-10-CM | POA: Diagnosis not present

## 2017-01-17 DIAGNOSIS — E1121 Type 2 diabetes mellitus with diabetic nephropathy: Secondary | ICD-10-CM | POA: Diagnosis not present

## 2017-01-17 DIAGNOSIS — E7800 Pure hypercholesterolemia, unspecified: Secondary | ICD-10-CM | POA: Diagnosis not present

## 2017-01-17 DIAGNOSIS — N183 Chronic kidney disease, stage 3 (moderate): Secondary | ICD-10-CM | POA: Diagnosis not present

## 2017-01-17 DIAGNOSIS — E1140 Type 2 diabetes mellitus with diabetic neuropathy, unspecified: Secondary | ICD-10-CM | POA: Diagnosis not present

## 2017-01-17 DIAGNOSIS — I10 Essential (primary) hypertension: Secondary | ICD-10-CM | POA: Diagnosis not present

## 2017-01-17 DIAGNOSIS — M6281 Muscle weakness (generalized): Secondary | ICD-10-CM | POA: Diagnosis not present

## 2017-01-17 DIAGNOSIS — R251 Tremor, unspecified: Secondary | ICD-10-CM | POA: Diagnosis not present

## 2017-01-17 DIAGNOSIS — J309 Allergic rhinitis, unspecified: Secondary | ICD-10-CM | POA: Diagnosis not present

## 2017-01-17 DIAGNOSIS — R2681 Unsteadiness on feet: Secondary | ICD-10-CM | POA: Diagnosis not present

## 2017-01-20 DIAGNOSIS — R278 Other lack of coordination: Secondary | ICD-10-CM | POA: Diagnosis not present

## 2017-01-20 DIAGNOSIS — M6281 Muscle weakness (generalized): Secondary | ICD-10-CM | POA: Diagnosis not present

## 2017-01-20 DIAGNOSIS — R2681 Unsteadiness on feet: Secondary | ICD-10-CM | POA: Diagnosis not present

## 2017-01-22 DIAGNOSIS — M6281 Muscle weakness (generalized): Secondary | ICD-10-CM | POA: Diagnosis not present

## 2017-01-22 DIAGNOSIS — R2681 Unsteadiness on feet: Secondary | ICD-10-CM | POA: Diagnosis not present

## 2017-01-22 DIAGNOSIS — R278 Other lack of coordination: Secondary | ICD-10-CM | POA: Diagnosis not present

## 2017-01-25 DIAGNOSIS — R278 Other lack of coordination: Secondary | ICD-10-CM | POA: Diagnosis not present

## 2017-01-25 DIAGNOSIS — R2681 Unsteadiness on feet: Secondary | ICD-10-CM | POA: Diagnosis not present

## 2017-01-25 DIAGNOSIS — M6281 Muscle weakness (generalized): Secondary | ICD-10-CM | POA: Diagnosis not present

## 2017-01-28 DIAGNOSIS — M6281 Muscle weakness (generalized): Secondary | ICD-10-CM | POA: Diagnosis not present

## 2017-01-28 DIAGNOSIS — R278 Other lack of coordination: Secondary | ICD-10-CM | POA: Diagnosis not present

## 2017-01-28 DIAGNOSIS — R296 Repeated falls: Secondary | ICD-10-CM | POA: Diagnosis not present

## 2017-01-28 DIAGNOSIS — R2681 Unsteadiness on feet: Secondary | ICD-10-CM | POA: Diagnosis not present

## 2017-01-28 DIAGNOSIS — M25562 Pain in left knee: Secondary | ICD-10-CM | POA: Diagnosis not present

## 2017-01-30 DIAGNOSIS — M6281 Muscle weakness (generalized): Secondary | ICD-10-CM | POA: Diagnosis not present

## 2017-01-30 DIAGNOSIS — R278 Other lack of coordination: Secondary | ICD-10-CM | POA: Diagnosis not present

## 2017-01-30 DIAGNOSIS — R296 Repeated falls: Secondary | ICD-10-CM | POA: Diagnosis not present

## 2017-01-30 DIAGNOSIS — R2681 Unsteadiness on feet: Secondary | ICD-10-CM | POA: Diagnosis not present

## 2017-01-30 DIAGNOSIS — M25562 Pain in left knee: Secondary | ICD-10-CM | POA: Diagnosis not present

## 2017-02-03 DIAGNOSIS — M6281 Muscle weakness (generalized): Secondary | ICD-10-CM | POA: Diagnosis not present

## 2017-02-03 DIAGNOSIS — R2681 Unsteadiness on feet: Secondary | ICD-10-CM | POA: Diagnosis not present

## 2017-02-03 DIAGNOSIS — R278 Other lack of coordination: Secondary | ICD-10-CM | POA: Diagnosis not present

## 2017-02-03 DIAGNOSIS — M25562 Pain in left knee: Secondary | ICD-10-CM | POA: Diagnosis not present

## 2017-02-03 DIAGNOSIS — R296 Repeated falls: Secondary | ICD-10-CM | POA: Diagnosis not present

## 2017-02-04 DIAGNOSIS — R278 Other lack of coordination: Secondary | ICD-10-CM | POA: Diagnosis not present

## 2017-02-04 DIAGNOSIS — R2681 Unsteadiness on feet: Secondary | ICD-10-CM | POA: Diagnosis not present

## 2017-02-04 DIAGNOSIS — R296 Repeated falls: Secondary | ICD-10-CM | POA: Diagnosis not present

## 2017-02-04 DIAGNOSIS — M25562 Pain in left knee: Secondary | ICD-10-CM | POA: Diagnosis not present

## 2017-02-04 DIAGNOSIS — M6281 Muscle weakness (generalized): Secondary | ICD-10-CM | POA: Diagnosis not present

## 2017-02-06 DIAGNOSIS — M6281 Muscle weakness (generalized): Secondary | ICD-10-CM | POA: Diagnosis not present

## 2017-02-06 DIAGNOSIS — M25562 Pain in left knee: Secondary | ICD-10-CM | POA: Diagnosis not present

## 2017-02-06 DIAGNOSIS — R278 Other lack of coordination: Secondary | ICD-10-CM | POA: Diagnosis not present

## 2017-02-06 DIAGNOSIS — R2681 Unsteadiness on feet: Secondary | ICD-10-CM | POA: Diagnosis not present

## 2017-02-06 DIAGNOSIS — R296 Repeated falls: Secondary | ICD-10-CM | POA: Diagnosis not present

## 2017-02-07 DIAGNOSIS — H26493 Other secondary cataract, bilateral: Secondary | ICD-10-CM | POA: Diagnosis not present

## 2017-02-07 DIAGNOSIS — H353131 Nonexudative age-related macular degeneration, bilateral, early dry stage: Secondary | ICD-10-CM | POA: Diagnosis not present

## 2017-02-07 DIAGNOSIS — H02834 Dermatochalasis of left upper eyelid: Secondary | ICD-10-CM | POA: Diagnosis not present

## 2017-02-07 DIAGNOSIS — H02831 Dermatochalasis of right upper eyelid: Secondary | ICD-10-CM | POA: Diagnosis not present

## 2017-02-11 DIAGNOSIS — M6281 Muscle weakness (generalized): Secondary | ICD-10-CM | POA: Diagnosis not present

## 2017-02-11 DIAGNOSIS — R278 Other lack of coordination: Secondary | ICD-10-CM | POA: Diagnosis not present

## 2017-02-11 DIAGNOSIS — M25562 Pain in left knee: Secondary | ICD-10-CM | POA: Diagnosis not present

## 2017-02-11 DIAGNOSIS — R296 Repeated falls: Secondary | ICD-10-CM | POA: Diagnosis not present

## 2017-02-11 DIAGNOSIS — R2681 Unsteadiness on feet: Secondary | ICD-10-CM | POA: Diagnosis not present

## 2017-02-12 DIAGNOSIS — I2510 Atherosclerotic heart disease of native coronary artery without angina pectoris: Secondary | ICD-10-CM | POA: Diagnosis not present

## 2017-02-12 DIAGNOSIS — R600 Localized edema: Secondary | ICD-10-CM | POA: Diagnosis not present

## 2017-02-12 DIAGNOSIS — E669 Obesity, unspecified: Secondary | ICD-10-CM | POA: Diagnosis not present

## 2017-02-12 DIAGNOSIS — I10 Essential (primary) hypertension: Secondary | ICD-10-CM | POA: Diagnosis not present

## 2017-02-12 DIAGNOSIS — E785 Hyperlipidemia, unspecified: Secondary | ICD-10-CM | POA: Diagnosis not present

## 2017-02-12 DIAGNOSIS — E119 Type 2 diabetes mellitus without complications: Secondary | ICD-10-CM | POA: Diagnosis not present

## 2017-02-25 DIAGNOSIS — H02423 Myogenic ptosis of bilateral eyelids: Secondary | ICD-10-CM | POA: Diagnosis not present

## 2017-02-27 DIAGNOSIS — G7000 Myasthenia gravis without (acute) exacerbation: Secondary | ICD-10-CM | POA: Diagnosis not present

## 2017-02-27 DIAGNOSIS — D649 Anemia, unspecified: Secondary | ICD-10-CM | POA: Diagnosis not present

## 2017-02-27 DIAGNOSIS — E119 Type 2 diabetes mellitus without complications: Secondary | ICD-10-CM | POA: Diagnosis not present

## 2017-02-28 DIAGNOSIS — I129 Hypertensive chronic kidney disease with stage 1 through stage 4 chronic kidney disease, or unspecified chronic kidney disease: Secondary | ICD-10-CM | POA: Diagnosis not present

## 2017-02-28 DIAGNOSIS — E782 Mixed hyperlipidemia: Secondary | ICD-10-CM | POA: Diagnosis not present

## 2017-02-28 DIAGNOSIS — E1142 Type 2 diabetes mellitus with diabetic polyneuropathy: Secondary | ICD-10-CM | POA: Diagnosis not present

## 2017-02-28 DIAGNOSIS — Z794 Long term (current) use of insulin: Secondary | ICD-10-CM | POA: Diagnosis not present

## 2017-02-28 DIAGNOSIS — N182 Chronic kidney disease, stage 2 (mild): Secondary | ICD-10-CM | POA: Diagnosis not present

## 2017-02-28 DIAGNOSIS — E1122 Type 2 diabetes mellitus with diabetic chronic kidney disease: Secondary | ICD-10-CM | POA: Diagnosis not present

## 2017-03-04 DIAGNOSIS — Z79899 Other long term (current) drug therapy: Secondary | ICD-10-CM | POA: Diagnosis not present

## 2017-03-04 DIAGNOSIS — D649 Anemia, unspecified: Secondary | ICD-10-CM | POA: Diagnosis not present

## 2017-03-06 DIAGNOSIS — E1051 Type 1 diabetes mellitus with diabetic peripheral angiopathy without gangrene: Secondary | ICD-10-CM | POA: Diagnosis not present

## 2017-03-06 DIAGNOSIS — R262 Difficulty in walking, not elsewhere classified: Secondary | ICD-10-CM | POA: Diagnosis not present

## 2017-03-06 DIAGNOSIS — B351 Tinea unguium: Secondary | ICD-10-CM | POA: Diagnosis not present

## 2017-03-10 DIAGNOSIS — M6281 Muscle weakness (generalized): Secondary | ICD-10-CM | POA: Diagnosis not present

## 2017-03-10 DIAGNOSIS — E119 Type 2 diabetes mellitus without complications: Secondary | ICD-10-CM | POA: Diagnosis not present

## 2017-03-18 DIAGNOSIS — E1122 Type 2 diabetes mellitus with diabetic chronic kidney disease: Secondary | ICD-10-CM | POA: Diagnosis not present

## 2017-03-18 DIAGNOSIS — I129 Hypertensive chronic kidney disease with stage 1 through stage 4 chronic kidney disease, or unspecified chronic kidney disease: Secondary | ICD-10-CM | POA: Diagnosis not present

## 2017-03-18 DIAGNOSIS — E559 Vitamin D deficiency, unspecified: Secondary | ICD-10-CM | POA: Diagnosis not present

## 2017-03-18 DIAGNOSIS — R05 Cough: Secondary | ICD-10-CM | POA: Diagnosis not present

## 2017-03-18 DIAGNOSIS — N179 Acute kidney failure, unspecified: Secondary | ICD-10-CM | POA: Diagnosis not present

## 2017-03-18 DIAGNOSIS — E782 Mixed hyperlipidemia: Secondary | ICD-10-CM | POA: Diagnosis not present

## 2017-03-18 DIAGNOSIS — N183 Chronic kidney disease, stage 3 (moderate): Secondary | ICD-10-CM | POA: Diagnosis not present

## 2017-03-18 DIAGNOSIS — Z6841 Body Mass Index (BMI) 40.0 and over, adult: Secondary | ICD-10-CM | POA: Diagnosis not present

## 2017-03-18 DIAGNOSIS — I444 Left anterior fascicular block: Secondary | ICD-10-CM | POA: Diagnosis not present

## 2017-03-18 DIAGNOSIS — Z794 Long term (current) use of insulin: Secondary | ICD-10-CM | POA: Diagnosis not present

## 2017-03-18 DIAGNOSIS — R739 Hyperglycemia, unspecified: Secondary | ICD-10-CM | POA: Diagnosis not present

## 2017-03-18 DIAGNOSIS — R600 Localized edema: Secondary | ICD-10-CM | POA: Diagnosis not present

## 2017-03-18 DIAGNOSIS — E1142 Type 2 diabetes mellitus with diabetic polyneuropathy: Secondary | ICD-10-CM | POA: Diagnosis not present

## 2017-03-18 DIAGNOSIS — I452 Bifascicular block: Secondary | ICD-10-CM | POA: Diagnosis not present

## 2017-03-18 DIAGNOSIS — E1165 Type 2 diabetes mellitus with hyperglycemia: Secondary | ICD-10-CM | POA: Diagnosis not present

## 2017-03-18 DIAGNOSIS — N390 Urinary tract infection, site not specified: Secondary | ICD-10-CM | POA: Diagnosis not present

## 2017-03-18 DIAGNOSIS — R2689 Other abnormalities of gait and mobility: Secondary | ICD-10-CM | POA: Diagnosis not present

## 2017-03-18 DIAGNOSIS — N182 Chronic kidney disease, stage 2 (mild): Secondary | ICD-10-CM | POA: Diagnosis not present

## 2017-03-18 DIAGNOSIS — I5033 Acute on chronic diastolic (congestive) heart failure: Secondary | ICD-10-CM | POA: Diagnosis not present

## 2017-03-18 DIAGNOSIS — E1065 Type 1 diabetes mellitus with hyperglycemia: Secondary | ICD-10-CM | POA: Diagnosis not present

## 2017-03-18 DIAGNOSIS — I130 Hypertensive heart and chronic kidney disease with heart failure and stage 1 through stage 4 chronic kidney disease, or unspecified chronic kidney disease: Secondary | ICD-10-CM | POA: Diagnosis not present

## 2017-03-19 DIAGNOSIS — E8770 Fluid overload, unspecified: Secondary | ICD-10-CM | POA: Diagnosis not present

## 2017-03-19 DIAGNOSIS — R100 Acute abdomen: Secondary | ICD-10-CM | POA: Diagnosis not present

## 2017-03-19 DIAGNOSIS — R278 Other lack of coordination: Secondary | ICD-10-CM | POA: Diagnosis not present

## 2017-03-19 DIAGNOSIS — I5030 Unspecified diastolic (congestive) heart failure: Secondary | ICD-10-CM | POA: Diagnosis not present

## 2017-03-19 DIAGNOSIS — Z79899 Other long term (current) drug therapy: Secondary | ICD-10-CM | POA: Diagnosis not present

## 2017-03-19 DIAGNOSIS — E119 Type 2 diabetes mellitus without complications: Secondary | ICD-10-CM | POA: Diagnosis not present

## 2017-03-19 DIAGNOSIS — Z7902 Long term (current) use of antithrombotics/antiplatelets: Secondary | ICD-10-CM | POA: Diagnosis not present

## 2017-03-19 DIAGNOSIS — Z90710 Acquired absence of both cervix and uterus: Secondary | ICD-10-CM | POA: Diagnosis not present

## 2017-03-19 DIAGNOSIS — Z6841 Body Mass Index (BMI) 40.0 and over, adult: Secondary | ICD-10-CM | POA: Diagnosis not present

## 2017-03-19 DIAGNOSIS — E782 Mixed hyperlipidemia: Secondary | ICD-10-CM | POA: Diagnosis present

## 2017-03-19 DIAGNOSIS — R8271 Bacteriuria: Secondary | ICD-10-CM | POA: Diagnosis not present

## 2017-03-19 DIAGNOSIS — I444 Left anterior fascicular block: Secondary | ICD-10-CM | POA: Diagnosis not present

## 2017-03-19 DIAGNOSIS — E1122 Type 2 diabetes mellitus with diabetic chronic kidney disease: Secondary | ICD-10-CM | POA: Diagnosis present

## 2017-03-19 DIAGNOSIS — R609 Edema, unspecified: Secondary | ICD-10-CM | POA: Diagnosis not present

## 2017-03-19 DIAGNOSIS — N183 Chronic kidney disease, stage 3 (moderate): Secondary | ICD-10-CM | POA: Diagnosis present

## 2017-03-19 DIAGNOSIS — Z8249 Family history of ischemic heart disease and other diseases of the circulatory system: Secondary | ICD-10-CM | POA: Diagnosis not present

## 2017-03-19 DIAGNOSIS — Z833 Family history of diabetes mellitus: Secondary | ICD-10-CM | POA: Diagnosis not present

## 2017-03-19 DIAGNOSIS — N390 Urinary tract infection, site not specified: Secondary | ICD-10-CM | POA: Diagnosis present

## 2017-03-19 DIAGNOSIS — R2681 Unsteadiness on feet: Secondary | ICD-10-CM | POA: Diagnosis not present

## 2017-03-19 DIAGNOSIS — Z794 Long term (current) use of insulin: Secondary | ICD-10-CM | POA: Diagnosis not present

## 2017-03-19 DIAGNOSIS — I110 Hypertensive heart disease with heart failure: Secondary | ICD-10-CM | POA: Diagnosis not present

## 2017-03-19 DIAGNOSIS — I130 Hypertensive heart and chronic kidney disease with heart failure and stage 1 through stage 4 chronic kidney disease, or unspecified chronic kidney disease: Secondary | ICD-10-CM | POA: Diagnosis present

## 2017-03-19 DIAGNOSIS — M79605 Pain in left leg: Secondary | ICD-10-CM | POA: Diagnosis not present

## 2017-03-19 DIAGNOSIS — E1142 Type 2 diabetes mellitus with diabetic polyneuropathy: Secondary | ICD-10-CM | POA: Diagnosis present

## 2017-03-19 DIAGNOSIS — I5033 Acute on chronic diastolic (congestive) heart failure: Secondary | ICD-10-CM | POA: Diagnosis present

## 2017-03-19 DIAGNOSIS — N179 Acute kidney failure, unspecified: Secondary | ICD-10-CM | POA: Diagnosis present

## 2017-03-19 DIAGNOSIS — Z8673 Personal history of transient ischemic attack (TIA), and cerebral infarction without residual deficits: Secondary | ICD-10-CM | POA: Diagnosis not present

## 2017-03-19 DIAGNOSIS — Z955 Presence of coronary angioplasty implant and graft: Secondary | ICD-10-CM | POA: Diagnosis not present

## 2017-03-19 DIAGNOSIS — I10 Essential (primary) hypertension: Secondary | ICD-10-CM | POA: Diagnosis not present

## 2017-03-19 DIAGNOSIS — E1140 Type 2 diabetes mellitus with diabetic neuropathy, unspecified: Secondary | ICD-10-CM | POA: Diagnosis not present

## 2017-03-19 DIAGNOSIS — E860 Dehydration: Secondary | ICD-10-CM | POA: Diagnosis present

## 2017-03-19 DIAGNOSIS — I452 Bifascicular block: Secondary | ICD-10-CM | POA: Diagnosis not present

## 2017-03-19 DIAGNOSIS — Z880 Allergy status to penicillin: Secondary | ICD-10-CM | POA: Diagnosis not present

## 2017-03-19 DIAGNOSIS — E1121 Type 2 diabetes mellitus with diabetic nephropathy: Secondary | ICD-10-CM | POA: Diagnosis present

## 2017-03-19 DIAGNOSIS — E1165 Type 2 diabetes mellitus with hyperglycemia: Secondary | ICD-10-CM | POA: Diagnosis present

## 2017-03-19 DIAGNOSIS — L89151 Pressure ulcer of sacral region, stage 1: Secondary | ICD-10-CM | POA: Diagnosis present

## 2017-03-19 DIAGNOSIS — I2510 Atherosclerotic heart disease of native coronary artery without angina pectoris: Secondary | ICD-10-CM | POA: Diagnosis present

## 2017-03-25 DIAGNOSIS — E785 Hyperlipidemia, unspecified: Secondary | ICD-10-CM | POA: Diagnosis not present

## 2017-03-25 DIAGNOSIS — N183 Chronic kidney disease, stage 3 (moderate): Secondary | ICD-10-CM | POA: Diagnosis not present

## 2017-03-25 DIAGNOSIS — I5030 Unspecified diastolic (congestive) heart failure: Secondary | ICD-10-CM | POA: Diagnosis not present

## 2017-03-25 DIAGNOSIS — Z23 Encounter for immunization: Secondary | ICD-10-CM | POA: Diagnosis not present

## 2017-03-25 DIAGNOSIS — I110 Hypertensive heart disease with heart failure: Secondary | ICD-10-CM | POA: Diagnosis not present

## 2017-03-25 DIAGNOSIS — R948 Abnormal results of function studies of other organs and systems: Secondary | ICD-10-CM | POA: Diagnosis not present

## 2017-03-25 DIAGNOSIS — E669 Obesity, unspecified: Secondary | ICD-10-CM | POA: Diagnosis not present

## 2017-03-25 DIAGNOSIS — E1140 Type 2 diabetes mellitus with diabetic neuropathy, unspecified: Secondary | ICD-10-CM | POA: Diagnosis not present

## 2017-03-25 DIAGNOSIS — E782 Mixed hyperlipidemia: Secondary | ICD-10-CM | POA: Diagnosis not present

## 2017-03-25 DIAGNOSIS — I5033 Acute on chronic diastolic (congestive) heart failure: Secondary | ICD-10-CM | POA: Diagnosis not present

## 2017-03-25 DIAGNOSIS — I10 Essential (primary) hypertension: Secondary | ICD-10-CM | POA: Diagnosis not present

## 2017-03-25 DIAGNOSIS — R0602 Shortness of breath: Secondary | ICD-10-CM | POA: Diagnosis not present

## 2017-03-25 DIAGNOSIS — R269 Unspecified abnormalities of gait and mobility: Secondary | ICD-10-CM | POA: Diagnosis not present

## 2017-03-25 DIAGNOSIS — R100 Acute abdomen: Secondary | ICD-10-CM | POA: Diagnosis not present

## 2017-03-25 DIAGNOSIS — M79605 Pain in left leg: Secondary | ICD-10-CM | POA: Diagnosis not present

## 2017-03-25 DIAGNOSIS — R2681 Unsteadiness on feet: Secondary | ICD-10-CM | POA: Diagnosis not present

## 2017-03-25 DIAGNOSIS — H02422 Myogenic ptosis of left eyelid: Secondary | ICD-10-CM | POA: Diagnosis not present

## 2017-03-25 DIAGNOSIS — Z8673 Personal history of transient ischemic attack (TIA), and cerebral infarction without residual deficits: Secondary | ICD-10-CM | POA: Diagnosis not present

## 2017-03-25 DIAGNOSIS — H02423 Myogenic ptosis of bilateral eyelids: Secondary | ICD-10-CM | POA: Diagnosis not present

## 2017-03-25 DIAGNOSIS — Z6841 Body Mass Index (BMI) 40.0 and over, adult: Secondary | ICD-10-CM | POA: Diagnosis not present

## 2017-03-25 DIAGNOSIS — E1121 Type 2 diabetes mellitus with diabetic nephropathy: Secondary | ICD-10-CM | POA: Diagnosis not present

## 2017-03-25 DIAGNOSIS — D649 Anemia, unspecified: Secondary | ICD-10-CM | POA: Diagnosis not present

## 2017-03-25 DIAGNOSIS — I130 Hypertensive heart and chronic kidney disease with heart failure and stage 1 through stage 4 chronic kidney disease, or unspecified chronic kidney disease: Secondary | ICD-10-CM | POA: Diagnosis not present

## 2017-03-25 DIAGNOSIS — Z01818 Encounter for other preprocedural examination: Secondary | ICD-10-CM | POA: Diagnosis not present

## 2017-03-25 DIAGNOSIS — I2510 Atherosclerotic heart disease of native coronary artery without angina pectoris: Secondary | ICD-10-CM | POA: Diagnosis not present

## 2017-03-25 DIAGNOSIS — R278 Other lack of coordination: Secondary | ICD-10-CM | POA: Diagnosis not present

## 2017-03-25 DIAGNOSIS — H02421 Myogenic ptosis of right eyelid: Secondary | ICD-10-CM | POA: Diagnosis not present

## 2017-03-25 DIAGNOSIS — E119 Type 2 diabetes mellitus without complications: Secondary | ICD-10-CM | POA: Diagnosis not present

## 2017-03-25 DIAGNOSIS — N179 Acute kidney failure, unspecified: Secondary | ICD-10-CM | POA: Diagnosis not present

## 2017-03-25 DIAGNOSIS — H02403 Unspecified ptosis of bilateral eyelids: Secondary | ICD-10-CM | POA: Diagnosis not present

## 2017-03-26 DIAGNOSIS — R269 Unspecified abnormalities of gait and mobility: Secondary | ICD-10-CM | POA: Diagnosis not present

## 2017-03-26 DIAGNOSIS — I2510 Atherosclerotic heart disease of native coronary artery without angina pectoris: Secondary | ICD-10-CM | POA: Diagnosis not present

## 2017-03-26 DIAGNOSIS — I10 Essential (primary) hypertension: Secondary | ICD-10-CM | POA: Diagnosis not present

## 2017-03-26 DIAGNOSIS — E119 Type 2 diabetes mellitus without complications: Secondary | ICD-10-CM | POA: Diagnosis not present

## 2017-03-26 DIAGNOSIS — E785 Hyperlipidemia, unspecified: Secondary | ICD-10-CM | POA: Diagnosis not present

## 2017-03-26 DIAGNOSIS — E669 Obesity, unspecified: Secondary | ICD-10-CM | POA: Diagnosis not present

## 2017-04-09 DIAGNOSIS — N179 Acute kidney failure, unspecified: Secondary | ICD-10-CM | POA: Diagnosis not present

## 2017-04-14 DIAGNOSIS — H02422 Myogenic ptosis of left eyelid: Secondary | ICD-10-CM | POA: Diagnosis not present

## 2017-04-14 DIAGNOSIS — H02403 Unspecified ptosis of bilateral eyelids: Secondary | ICD-10-CM | POA: Diagnosis not present

## 2017-04-14 DIAGNOSIS — H02423 Myogenic ptosis of bilateral eyelids: Secondary | ICD-10-CM | POA: Diagnosis not present

## 2017-04-14 DIAGNOSIS — H02421 Myogenic ptosis of right eyelid: Secondary | ICD-10-CM | POA: Diagnosis not present

## 2017-04-14 DIAGNOSIS — Z01818 Encounter for other preprocedural examination: Secondary | ICD-10-CM | POA: Diagnosis not present

## 2017-04-20 DIAGNOSIS — M6281 Muscle weakness (generalized): Secondary | ICD-10-CM | POA: Diagnosis not present

## 2017-04-20 DIAGNOSIS — R2689 Other abnormalities of gait and mobility: Secondary | ICD-10-CM | POA: Diagnosis not present

## 2017-04-20 DIAGNOSIS — I5033 Acute on chronic diastolic (congestive) heart failure: Secondary | ICD-10-CM | POA: Diagnosis not present

## 2017-04-21 DIAGNOSIS — R2689 Other abnormalities of gait and mobility: Secondary | ICD-10-CM | POA: Diagnosis not present

## 2017-04-21 DIAGNOSIS — K5900 Constipation, unspecified: Secondary | ICD-10-CM | POA: Diagnosis not present

## 2017-04-21 DIAGNOSIS — E785 Hyperlipidemia, unspecified: Secondary | ICD-10-CM | POA: Diagnosis not present

## 2017-04-21 DIAGNOSIS — I2510 Atherosclerotic heart disease of native coronary artery without angina pectoris: Secondary | ICD-10-CM | POA: Diagnosis not present

## 2017-04-21 DIAGNOSIS — R52 Pain, unspecified: Secondary | ICD-10-CM | POA: Diagnosis not present

## 2017-04-21 DIAGNOSIS — M6281 Muscle weakness (generalized): Secondary | ICD-10-CM | POA: Diagnosis not present

## 2017-04-21 DIAGNOSIS — J309 Allergic rhinitis, unspecified: Secondary | ICD-10-CM | POA: Diagnosis not present

## 2017-04-21 DIAGNOSIS — I5033 Acute on chronic diastolic (congestive) heart failure: Secondary | ICD-10-CM | POA: Diagnosis not present

## 2017-04-21 DIAGNOSIS — R609 Edema, unspecified: Secondary | ICD-10-CM | POA: Diagnosis not present

## 2017-04-21 DIAGNOSIS — E119 Type 2 diabetes mellitus without complications: Secondary | ICD-10-CM | POA: Diagnosis not present

## 2017-04-21 DIAGNOSIS — I10 Essential (primary) hypertension: Secondary | ICD-10-CM | POA: Diagnosis not present

## 2017-04-22 DIAGNOSIS — M6281 Muscle weakness (generalized): Secondary | ICD-10-CM | POA: Diagnosis not present

## 2017-04-22 DIAGNOSIS — R2689 Other abnormalities of gait and mobility: Secondary | ICD-10-CM | POA: Diagnosis not present

## 2017-04-22 DIAGNOSIS — I5033 Acute on chronic diastolic (congestive) heart failure: Secondary | ICD-10-CM | POA: Diagnosis not present

## 2017-04-23 DIAGNOSIS — R2689 Other abnormalities of gait and mobility: Secondary | ICD-10-CM | POA: Diagnosis not present

## 2017-04-23 DIAGNOSIS — I2510 Atherosclerotic heart disease of native coronary artery without angina pectoris: Secondary | ICD-10-CM | POA: Diagnosis not present

## 2017-04-23 DIAGNOSIS — I5033 Acute on chronic diastolic (congestive) heart failure: Secondary | ICD-10-CM | POA: Diagnosis not present

## 2017-04-23 DIAGNOSIS — H01009 Unspecified blepharitis unspecified eye, unspecified eyelid: Secondary | ICD-10-CM | POA: Diagnosis not present

## 2017-04-23 DIAGNOSIS — J309 Allergic rhinitis, unspecified: Secondary | ICD-10-CM | POA: Diagnosis not present

## 2017-04-23 DIAGNOSIS — I10 Essential (primary) hypertension: Secondary | ICD-10-CM | POA: Diagnosis not present

## 2017-04-23 DIAGNOSIS — R52 Pain, unspecified: Secondary | ICD-10-CM | POA: Diagnosis not present

## 2017-04-23 DIAGNOSIS — K5900 Constipation, unspecified: Secondary | ICD-10-CM | POA: Diagnosis not present

## 2017-04-23 DIAGNOSIS — E785 Hyperlipidemia, unspecified: Secondary | ICD-10-CM | POA: Diagnosis not present

## 2017-04-23 DIAGNOSIS — E119 Type 2 diabetes mellitus without complications: Secondary | ICD-10-CM | POA: Diagnosis not present

## 2017-04-23 DIAGNOSIS — M6281 Muscle weakness (generalized): Secondary | ICD-10-CM | POA: Diagnosis not present

## 2017-04-23 DIAGNOSIS — R609 Edema, unspecified: Secondary | ICD-10-CM | POA: Diagnosis not present

## 2017-04-24 DIAGNOSIS — R52 Pain, unspecified: Secondary | ICD-10-CM | POA: Diagnosis not present

## 2017-04-24 DIAGNOSIS — R609 Edema, unspecified: Secondary | ICD-10-CM | POA: Diagnosis not present

## 2017-04-24 DIAGNOSIS — H01009 Unspecified blepharitis unspecified eye, unspecified eyelid: Secondary | ICD-10-CM | POA: Diagnosis not present

## 2017-04-24 DIAGNOSIS — J309 Allergic rhinitis, unspecified: Secondary | ICD-10-CM | POA: Diagnosis not present

## 2017-04-24 DIAGNOSIS — I2510 Atherosclerotic heart disease of native coronary artery without angina pectoris: Secondary | ICD-10-CM | POA: Diagnosis not present

## 2017-04-24 DIAGNOSIS — R2689 Other abnormalities of gait and mobility: Secondary | ICD-10-CM | POA: Diagnosis not present

## 2017-04-24 DIAGNOSIS — I5033 Acute on chronic diastolic (congestive) heart failure: Secondary | ICD-10-CM | POA: Diagnosis not present

## 2017-04-24 DIAGNOSIS — M6281 Muscle weakness (generalized): Secondary | ICD-10-CM | POA: Diagnosis not present

## 2017-04-24 DIAGNOSIS — E785 Hyperlipidemia, unspecified: Secondary | ICD-10-CM | POA: Diagnosis not present

## 2017-04-24 DIAGNOSIS — E119 Type 2 diabetes mellitus without complications: Secondary | ICD-10-CM | POA: Diagnosis not present

## 2017-04-24 DIAGNOSIS — I10 Essential (primary) hypertension: Secondary | ICD-10-CM | POA: Diagnosis not present

## 2017-04-24 DIAGNOSIS — K5900 Constipation, unspecified: Secondary | ICD-10-CM | POA: Diagnosis not present

## 2017-04-25 DIAGNOSIS — R2689 Other abnormalities of gait and mobility: Secondary | ICD-10-CM | POA: Diagnosis not present

## 2017-04-25 DIAGNOSIS — I5033 Acute on chronic diastolic (congestive) heart failure: Secondary | ICD-10-CM | POA: Diagnosis not present

## 2017-04-25 DIAGNOSIS — M6281 Muscle weakness (generalized): Secondary | ICD-10-CM | POA: Diagnosis not present

## 2017-04-28 DIAGNOSIS — R718 Other abnormality of red blood cells: Secondary | ICD-10-CM | POA: Diagnosis not present

## 2017-04-28 DIAGNOSIS — J309 Allergic rhinitis, unspecified: Secondary | ICD-10-CM | POA: Diagnosis not present

## 2017-04-28 DIAGNOSIS — H01009 Unspecified blepharitis unspecified eye, unspecified eyelid: Secondary | ICD-10-CM | POA: Diagnosis not present

## 2017-04-28 DIAGNOSIS — D729 Disorder of white blood cells, unspecified: Secondary | ICD-10-CM | POA: Diagnosis not present

## 2017-04-28 DIAGNOSIS — R52 Pain, unspecified: Secondary | ICD-10-CM | POA: Diagnosis not present

## 2017-04-28 DIAGNOSIS — M6281 Muscle weakness (generalized): Secondary | ICD-10-CM | POA: Diagnosis not present

## 2017-04-28 DIAGNOSIS — E119 Type 2 diabetes mellitus without complications: Secondary | ICD-10-CM | POA: Diagnosis not present

## 2017-04-28 DIAGNOSIS — I5033 Acute on chronic diastolic (congestive) heart failure: Secondary | ICD-10-CM | POA: Diagnosis not present

## 2017-04-28 DIAGNOSIS — K5900 Constipation, unspecified: Secondary | ICD-10-CM | POA: Diagnosis not present

## 2017-04-28 DIAGNOSIS — I2510 Atherosclerotic heart disease of native coronary artery without angina pectoris: Secondary | ICD-10-CM | POA: Diagnosis not present

## 2017-04-28 DIAGNOSIS — R2689 Other abnormalities of gait and mobility: Secondary | ICD-10-CM | POA: Diagnosis not present

## 2017-04-28 DIAGNOSIS — E785 Hyperlipidemia, unspecified: Secondary | ICD-10-CM | POA: Diagnosis not present

## 2017-04-28 DIAGNOSIS — R609 Edema, unspecified: Secondary | ICD-10-CM | POA: Diagnosis not present

## 2017-04-28 DIAGNOSIS — D649 Anemia, unspecified: Secondary | ICD-10-CM | POA: Diagnosis not present

## 2017-04-28 DIAGNOSIS — R0602 Shortness of breath: Secondary | ICD-10-CM | POA: Diagnosis not present

## 2017-04-28 DIAGNOSIS — I10 Essential (primary) hypertension: Secondary | ICD-10-CM | POA: Diagnosis not present

## 2017-04-29 DIAGNOSIS — I5033 Acute on chronic diastolic (congestive) heart failure: Secondary | ICD-10-CM | POA: Diagnosis not present

## 2017-04-29 DIAGNOSIS — R2689 Other abnormalities of gait and mobility: Secondary | ICD-10-CM | POA: Diagnosis not present

## 2017-04-29 DIAGNOSIS — M6281 Muscle weakness (generalized): Secondary | ICD-10-CM | POA: Diagnosis not present

## 2017-04-30 DIAGNOSIS — I10 Essential (primary) hypertension: Secondary | ICD-10-CM | POA: Diagnosis not present

## 2017-04-30 DIAGNOSIS — R52 Pain, unspecified: Secondary | ICD-10-CM | POA: Diagnosis not present

## 2017-04-30 DIAGNOSIS — M6281 Muscle weakness (generalized): Secondary | ICD-10-CM | POA: Diagnosis not present

## 2017-04-30 DIAGNOSIS — R2689 Other abnormalities of gait and mobility: Secondary | ICD-10-CM | POA: Diagnosis not present

## 2017-04-30 DIAGNOSIS — K5900 Constipation, unspecified: Secondary | ICD-10-CM | POA: Diagnosis not present

## 2017-04-30 DIAGNOSIS — I5033 Acute on chronic diastolic (congestive) heart failure: Secondary | ICD-10-CM | POA: Diagnosis not present

## 2017-04-30 DIAGNOSIS — I2510 Atherosclerotic heart disease of native coronary artery without angina pectoris: Secondary | ICD-10-CM | POA: Diagnosis not present

## 2017-04-30 DIAGNOSIS — E785 Hyperlipidemia, unspecified: Secondary | ICD-10-CM | POA: Diagnosis not present

## 2017-04-30 DIAGNOSIS — E119 Type 2 diabetes mellitus without complications: Secondary | ICD-10-CM | POA: Diagnosis not present

## 2017-04-30 DIAGNOSIS — J309 Allergic rhinitis, unspecified: Secondary | ICD-10-CM | POA: Diagnosis not present

## 2017-04-30 DIAGNOSIS — H01009 Unspecified blepharitis unspecified eye, unspecified eyelid: Secondary | ICD-10-CM | POA: Diagnosis not present

## 2017-04-30 DIAGNOSIS — R609 Edema, unspecified: Secondary | ICD-10-CM | POA: Diagnosis not present

## 2017-05-01 DIAGNOSIS — H01009 Unspecified blepharitis unspecified eye, unspecified eyelid: Secondary | ICD-10-CM | POA: Diagnosis not present

## 2017-05-01 DIAGNOSIS — J309 Allergic rhinitis, unspecified: Secondary | ICD-10-CM | POA: Diagnosis not present

## 2017-05-01 DIAGNOSIS — R2689 Other abnormalities of gait and mobility: Secondary | ICD-10-CM | POA: Diagnosis not present

## 2017-05-01 DIAGNOSIS — I5033 Acute on chronic diastolic (congestive) heart failure: Secondary | ICD-10-CM | POA: Diagnosis not present

## 2017-05-01 DIAGNOSIS — M6281 Muscle weakness (generalized): Secondary | ICD-10-CM | POA: Diagnosis not present

## 2017-05-02 DIAGNOSIS — H01009 Unspecified blepharitis unspecified eye, unspecified eyelid: Secondary | ICD-10-CM | POA: Diagnosis not present

## 2017-05-02 DIAGNOSIS — J309 Allergic rhinitis, unspecified: Secondary | ICD-10-CM | POA: Diagnosis not present

## 2017-05-02 DIAGNOSIS — M6281 Muscle weakness (generalized): Secondary | ICD-10-CM | POA: Diagnosis not present

## 2017-05-02 DIAGNOSIS — R05 Cough: Secondary | ICD-10-CM | POA: Diagnosis not present

## 2017-05-02 DIAGNOSIS — R2689 Other abnormalities of gait and mobility: Secondary | ICD-10-CM | POA: Diagnosis not present

## 2017-05-02 DIAGNOSIS — J811 Chronic pulmonary edema: Secondary | ICD-10-CM | POA: Diagnosis not present

## 2017-05-02 DIAGNOSIS — I5033 Acute on chronic diastolic (congestive) heart failure: Secondary | ICD-10-CM | POA: Diagnosis not present

## 2017-05-05 DIAGNOSIS — B370 Candidal stomatitis: Secondary | ICD-10-CM | POA: Diagnosis not present

## 2017-05-05 DIAGNOSIS — E785 Hyperlipidemia, unspecified: Secondary | ICD-10-CM | POA: Diagnosis not present

## 2017-05-05 DIAGNOSIS — I10 Essential (primary) hypertension: Secondary | ICD-10-CM | POA: Diagnosis not present

## 2017-05-05 DIAGNOSIS — M6281 Muscle weakness (generalized): Secondary | ICD-10-CM | POA: Diagnosis not present

## 2017-05-05 DIAGNOSIS — J309 Allergic rhinitis, unspecified: Secondary | ICD-10-CM | POA: Diagnosis not present

## 2017-05-05 DIAGNOSIS — I5033 Acute on chronic diastolic (congestive) heart failure: Secondary | ICD-10-CM | POA: Diagnosis not present

## 2017-05-05 DIAGNOSIS — E119 Type 2 diabetes mellitus without complications: Secondary | ICD-10-CM | POA: Diagnosis not present

## 2017-05-05 DIAGNOSIS — K5900 Constipation, unspecified: Secondary | ICD-10-CM | POA: Diagnosis not present

## 2017-05-05 DIAGNOSIS — R2689 Other abnormalities of gait and mobility: Secondary | ICD-10-CM | POA: Diagnosis not present

## 2017-05-05 DIAGNOSIS — I2510 Atherosclerotic heart disease of native coronary artery without angina pectoris: Secondary | ICD-10-CM | POA: Diagnosis not present

## 2017-05-06 DIAGNOSIS — M6281 Muscle weakness (generalized): Secondary | ICD-10-CM | POA: Diagnosis not present

## 2017-05-06 DIAGNOSIS — I5033 Acute on chronic diastolic (congestive) heart failure: Secondary | ICD-10-CM | POA: Diagnosis not present

## 2017-05-06 DIAGNOSIS — R2689 Other abnormalities of gait and mobility: Secondary | ICD-10-CM | POA: Diagnosis not present

## 2017-05-07 DIAGNOSIS — I10 Essential (primary) hypertension: Secondary | ICD-10-CM | POA: Diagnosis not present

## 2017-05-07 DIAGNOSIS — J309 Allergic rhinitis, unspecified: Secondary | ICD-10-CM | POA: Diagnosis not present

## 2017-05-07 DIAGNOSIS — R2689 Other abnormalities of gait and mobility: Secondary | ICD-10-CM | POA: Diagnosis not present

## 2017-05-07 DIAGNOSIS — K5900 Constipation, unspecified: Secondary | ICD-10-CM | POA: Diagnosis not present

## 2017-05-07 DIAGNOSIS — I5033 Acute on chronic diastolic (congestive) heart failure: Secondary | ICD-10-CM | POA: Diagnosis not present

## 2017-05-07 DIAGNOSIS — E119 Type 2 diabetes mellitus without complications: Secondary | ICD-10-CM | POA: Diagnosis not present

## 2017-05-07 DIAGNOSIS — J209 Acute bronchitis, unspecified: Secondary | ICD-10-CM | POA: Diagnosis not present

## 2017-05-07 DIAGNOSIS — I2510 Atherosclerotic heart disease of native coronary artery without angina pectoris: Secondary | ICD-10-CM | POA: Diagnosis not present

## 2017-05-07 DIAGNOSIS — E785 Hyperlipidemia, unspecified: Secondary | ICD-10-CM | POA: Diagnosis not present

## 2017-05-07 DIAGNOSIS — M6281 Muscle weakness (generalized): Secondary | ICD-10-CM | POA: Diagnosis not present

## 2017-05-07 DIAGNOSIS — E1165 Type 2 diabetes mellitus with hyperglycemia: Secondary | ICD-10-CM | POA: Diagnosis not present

## 2017-05-07 DIAGNOSIS — B370 Candidal stomatitis: Secondary | ICD-10-CM | POA: Diagnosis not present

## 2017-05-08 DIAGNOSIS — M6281 Muscle weakness (generalized): Secondary | ICD-10-CM | POA: Diagnosis not present

## 2017-05-08 DIAGNOSIS — I5033 Acute on chronic diastolic (congestive) heart failure: Secondary | ICD-10-CM | POA: Diagnosis not present

## 2017-05-08 DIAGNOSIS — R2689 Other abnormalities of gait and mobility: Secondary | ICD-10-CM | POA: Diagnosis not present

## 2017-05-09 DIAGNOSIS — E119 Type 2 diabetes mellitus without complications: Secondary | ICD-10-CM | POA: Diagnosis not present

## 2017-05-09 DIAGNOSIS — E1165 Type 2 diabetes mellitus with hyperglycemia: Secondary | ICD-10-CM | POA: Diagnosis not present

## 2017-05-09 DIAGNOSIS — M6281 Muscle weakness (generalized): Secondary | ICD-10-CM | POA: Diagnosis not present

## 2017-05-09 DIAGNOSIS — K5900 Constipation, unspecified: Secondary | ICD-10-CM | POA: Diagnosis not present

## 2017-05-09 DIAGNOSIS — J209 Acute bronchitis, unspecified: Secondary | ICD-10-CM | POA: Diagnosis not present

## 2017-05-09 DIAGNOSIS — I5033 Acute on chronic diastolic (congestive) heart failure: Secondary | ICD-10-CM | POA: Diagnosis not present

## 2017-05-09 DIAGNOSIS — I10 Essential (primary) hypertension: Secondary | ICD-10-CM | POA: Diagnosis not present

## 2017-05-09 DIAGNOSIS — E785 Hyperlipidemia, unspecified: Secondary | ICD-10-CM | POA: Diagnosis not present

## 2017-05-09 DIAGNOSIS — J309 Allergic rhinitis, unspecified: Secondary | ICD-10-CM | POA: Diagnosis not present

## 2017-05-09 DIAGNOSIS — I2510 Atherosclerotic heart disease of native coronary artery without angina pectoris: Secondary | ICD-10-CM | POA: Diagnosis not present

## 2017-05-09 DIAGNOSIS — R2689 Other abnormalities of gait and mobility: Secondary | ICD-10-CM | POA: Diagnosis not present

## 2017-05-12 DIAGNOSIS — E1151 Type 2 diabetes mellitus with diabetic peripheral angiopathy without gangrene: Secondary | ICD-10-CM | POA: Diagnosis not present

## 2017-05-12 DIAGNOSIS — R2689 Other abnormalities of gait and mobility: Secondary | ICD-10-CM | POA: Diagnosis not present

## 2017-05-12 DIAGNOSIS — M6281 Muscle weakness (generalized): Secondary | ICD-10-CM | POA: Diagnosis not present

## 2017-05-12 DIAGNOSIS — I5033 Acute on chronic diastolic (congestive) heart failure: Secondary | ICD-10-CM | POA: Diagnosis not present

## 2017-05-12 DIAGNOSIS — L603 Nail dystrophy: Secondary | ICD-10-CM | POA: Diagnosis not present

## 2017-05-13 DIAGNOSIS — J209 Acute bronchitis, unspecified: Secondary | ICD-10-CM | POA: Diagnosis not present

## 2017-05-13 DIAGNOSIS — M6281 Muscle weakness (generalized): Secondary | ICD-10-CM | POA: Diagnosis not present

## 2017-05-13 DIAGNOSIS — E119 Type 2 diabetes mellitus without complications: Secondary | ICD-10-CM | POA: Diagnosis not present

## 2017-05-13 DIAGNOSIS — I2510 Atherosclerotic heart disease of native coronary artery without angina pectoris: Secondary | ICD-10-CM | POA: Diagnosis not present

## 2017-05-13 DIAGNOSIS — I5033 Acute on chronic diastolic (congestive) heart failure: Secondary | ICD-10-CM | POA: Diagnosis not present

## 2017-05-13 DIAGNOSIS — R2689 Other abnormalities of gait and mobility: Secondary | ICD-10-CM | POA: Diagnosis not present

## 2017-05-13 DIAGNOSIS — E1165 Type 2 diabetes mellitus with hyperglycemia: Secondary | ICD-10-CM | POA: Diagnosis not present

## 2017-05-13 DIAGNOSIS — I10 Essential (primary) hypertension: Secondary | ICD-10-CM | POA: Diagnosis not present

## 2017-05-13 DIAGNOSIS — S71109A Unspecified open wound, unspecified thigh, initial encounter: Secondary | ICD-10-CM | POA: Diagnosis not present

## 2017-05-13 DIAGNOSIS — J309 Allergic rhinitis, unspecified: Secondary | ICD-10-CM | POA: Diagnosis not present

## 2017-05-14 DIAGNOSIS — M6281 Muscle weakness (generalized): Secondary | ICD-10-CM | POA: Diagnosis not present

## 2017-05-14 DIAGNOSIS — R2689 Other abnormalities of gait and mobility: Secondary | ICD-10-CM | POA: Diagnosis not present

## 2017-05-14 DIAGNOSIS — I5033 Acute on chronic diastolic (congestive) heart failure: Secondary | ICD-10-CM | POA: Diagnosis not present

## 2017-05-15 DIAGNOSIS — I5033 Acute on chronic diastolic (congestive) heart failure: Secondary | ICD-10-CM | POA: Diagnosis not present

## 2017-05-15 DIAGNOSIS — M6281 Muscle weakness (generalized): Secondary | ICD-10-CM | POA: Diagnosis not present

## 2017-05-15 DIAGNOSIS — R2689 Other abnormalities of gait and mobility: Secondary | ICD-10-CM | POA: Diagnosis not present

## 2017-05-16 DIAGNOSIS — M6281 Muscle weakness (generalized): Secondary | ICD-10-CM | POA: Diagnosis not present

## 2017-05-16 DIAGNOSIS — I5033 Acute on chronic diastolic (congestive) heart failure: Secondary | ICD-10-CM | POA: Diagnosis not present

## 2017-05-16 DIAGNOSIS — R2689 Other abnormalities of gait and mobility: Secondary | ICD-10-CM | POA: Diagnosis not present

## 2017-05-17 DIAGNOSIS — R2689 Other abnormalities of gait and mobility: Secondary | ICD-10-CM | POA: Diagnosis not present

## 2017-05-17 DIAGNOSIS — I5033 Acute on chronic diastolic (congestive) heart failure: Secondary | ICD-10-CM | POA: Diagnosis not present

## 2017-05-17 DIAGNOSIS — M6281 Muscle weakness (generalized): Secondary | ICD-10-CM | POA: Diagnosis not present

## 2017-05-19 DIAGNOSIS — I5033 Acute on chronic diastolic (congestive) heart failure: Secondary | ICD-10-CM | POA: Diagnosis not present

## 2017-05-19 DIAGNOSIS — M6281 Muscle weakness (generalized): Secondary | ICD-10-CM | POA: Diagnosis not present

## 2017-05-19 DIAGNOSIS — R2689 Other abnormalities of gait and mobility: Secondary | ICD-10-CM | POA: Diagnosis not present

## 2017-05-22 DIAGNOSIS — M6281 Muscle weakness (generalized): Secondary | ICD-10-CM | POA: Diagnosis not present

## 2017-05-22 DIAGNOSIS — J309 Allergic rhinitis, unspecified: Secondary | ICD-10-CM | POA: Diagnosis not present

## 2017-05-22 DIAGNOSIS — R062 Wheezing: Secondary | ICD-10-CM | POA: Diagnosis not present

## 2017-05-22 DIAGNOSIS — K5900 Constipation, unspecified: Secondary | ICD-10-CM | POA: Diagnosis not present

## 2017-05-22 DIAGNOSIS — R05 Cough: Secondary | ICD-10-CM | POA: Diagnosis not present

## 2017-05-22 DIAGNOSIS — I10 Essential (primary) hypertension: Secondary | ICD-10-CM | POA: Diagnosis not present

## 2017-05-22 DIAGNOSIS — H1030 Unspecified acute conjunctivitis, unspecified eye: Secondary | ICD-10-CM | POA: Diagnosis not present

## 2017-05-22 DIAGNOSIS — E119 Type 2 diabetes mellitus without complications: Secondary | ICD-10-CM | POA: Diagnosis not present

## 2017-05-22 DIAGNOSIS — R609 Edema, unspecified: Secondary | ICD-10-CM | POA: Diagnosis not present

## 2017-05-22 DIAGNOSIS — S81809A Unspecified open wound, unspecified lower leg, initial encounter: Secondary | ICD-10-CM | POA: Diagnosis not present

## 2017-05-22 DIAGNOSIS — J209 Acute bronchitis, unspecified: Secondary | ICD-10-CM | POA: Diagnosis not present

## 2017-05-22 DIAGNOSIS — I2510 Atherosclerotic heart disease of native coronary artery without angina pectoris: Secondary | ICD-10-CM | POA: Diagnosis not present

## 2017-05-23 DIAGNOSIS — Z79899 Other long term (current) drug therapy: Secondary | ICD-10-CM | POA: Diagnosis not present

## 2017-05-23 DIAGNOSIS — R945 Abnormal results of liver function studies: Secondary | ICD-10-CM | POA: Diagnosis not present

## 2017-05-23 DIAGNOSIS — R7989 Other specified abnormal findings of blood chemistry: Secondary | ICD-10-CM | POA: Diagnosis not present

## 2017-05-23 DIAGNOSIS — D649 Anemia, unspecified: Secondary | ICD-10-CM | POA: Diagnosis not present

## 2017-05-26 DIAGNOSIS — H2703 Aphakia, bilateral: Secondary | ICD-10-CM | POA: Diagnosis not present

## 2017-05-26 DIAGNOSIS — H353132 Nonexudative age-related macular degeneration, bilateral, intermediate dry stage: Secondary | ICD-10-CM | POA: Diagnosis not present

## 2017-05-26 DIAGNOSIS — H04123 Dry eye syndrome of bilateral lacrimal glands: Secondary | ICD-10-CM | POA: Diagnosis not present

## 2017-05-26 DIAGNOSIS — E119 Type 2 diabetes mellitus without complications: Secondary | ICD-10-CM | POA: Diagnosis not present

## 2017-05-27 DIAGNOSIS — I509 Heart failure, unspecified: Secondary | ICD-10-CM | POA: Diagnosis not present

## 2017-05-27 DIAGNOSIS — I10 Essential (primary) hypertension: Secondary | ICD-10-CM | POA: Diagnosis not present

## 2017-05-27 DIAGNOSIS — E119 Type 2 diabetes mellitus without complications: Secondary | ICD-10-CM | POA: Diagnosis not present

## 2017-05-27 DIAGNOSIS — M6281 Muscle weakness (generalized): Secondary | ICD-10-CM | POA: Diagnosis not present

## 2017-05-27 DIAGNOSIS — R609 Edema, unspecified: Secondary | ICD-10-CM | POA: Diagnosis not present

## 2017-05-27 DIAGNOSIS — H02403 Unspecified ptosis of bilateral eyelids: Secondary | ICD-10-CM | POA: Diagnosis not present

## 2017-05-28 DIAGNOSIS — R2689 Other abnormalities of gait and mobility: Secondary | ICD-10-CM | POA: Diagnosis not present

## 2017-05-28 DIAGNOSIS — I5033 Acute on chronic diastolic (congestive) heart failure: Secondary | ICD-10-CM | POA: Diagnosis not present

## 2017-05-28 DIAGNOSIS — M6281 Muscle weakness (generalized): Secondary | ICD-10-CM | POA: Diagnosis not present

## 2017-05-29 DIAGNOSIS — M6281 Muscle weakness (generalized): Secondary | ICD-10-CM | POA: Diagnosis not present

## 2017-05-29 DIAGNOSIS — I5033 Acute on chronic diastolic (congestive) heart failure: Secondary | ICD-10-CM | POA: Diagnosis not present

## 2017-05-29 DIAGNOSIS — R2689 Other abnormalities of gait and mobility: Secondary | ICD-10-CM | POA: Diagnosis not present

## 2017-05-30 DIAGNOSIS — I5033 Acute on chronic diastolic (congestive) heart failure: Secondary | ICD-10-CM | POA: Diagnosis not present

## 2017-05-30 DIAGNOSIS — M6281 Muscle weakness (generalized): Secondary | ICD-10-CM | POA: Diagnosis not present

## 2017-05-30 DIAGNOSIS — R2689 Other abnormalities of gait and mobility: Secondary | ICD-10-CM | POA: Diagnosis not present

## 2017-06-02 DIAGNOSIS — M6281 Muscle weakness (generalized): Secondary | ICD-10-CM | POA: Diagnosis not present

## 2017-06-02 DIAGNOSIS — R2689 Other abnormalities of gait and mobility: Secondary | ICD-10-CM | POA: Diagnosis not present

## 2017-06-02 DIAGNOSIS — I5033 Acute on chronic diastolic (congestive) heart failure: Secondary | ICD-10-CM | POA: Diagnosis not present

## 2017-06-03 DIAGNOSIS — M6281 Muscle weakness (generalized): Secondary | ICD-10-CM | POA: Diagnosis not present

## 2017-06-03 DIAGNOSIS — R2689 Other abnormalities of gait and mobility: Secondary | ICD-10-CM | POA: Diagnosis not present

## 2017-06-03 DIAGNOSIS — I5033 Acute on chronic diastolic (congestive) heart failure: Secondary | ICD-10-CM | POA: Diagnosis not present

## 2017-06-04 DIAGNOSIS — R609 Edema, unspecified: Secondary | ICD-10-CM | POA: Diagnosis not present

## 2017-06-04 DIAGNOSIS — R2689 Other abnormalities of gait and mobility: Secondary | ICD-10-CM | POA: Diagnosis not present

## 2017-06-04 DIAGNOSIS — E119 Type 2 diabetes mellitus without complications: Secondary | ICD-10-CM | POA: Diagnosis not present

## 2017-06-04 DIAGNOSIS — I10 Essential (primary) hypertension: Secondary | ICD-10-CM | POA: Diagnosis not present

## 2017-06-04 DIAGNOSIS — I5033 Acute on chronic diastolic (congestive) heart failure: Secondary | ICD-10-CM | POA: Diagnosis not present

## 2017-06-04 DIAGNOSIS — H02403 Unspecified ptosis of bilateral eyelids: Secondary | ICD-10-CM | POA: Diagnosis not present

## 2017-06-04 DIAGNOSIS — M6281 Muscle weakness (generalized): Secondary | ICD-10-CM | POA: Diagnosis not present

## 2017-06-04 DIAGNOSIS — I509 Heart failure, unspecified: Secondary | ICD-10-CM | POA: Diagnosis not present

## 2017-06-05 DIAGNOSIS — M6281 Muscle weakness (generalized): Secondary | ICD-10-CM | POA: Diagnosis not present

## 2017-06-05 DIAGNOSIS — R2689 Other abnormalities of gait and mobility: Secondary | ICD-10-CM | POA: Diagnosis not present

## 2017-06-05 DIAGNOSIS — I5033 Acute on chronic diastolic (congestive) heart failure: Secondary | ICD-10-CM | POA: Diagnosis not present

## 2017-06-06 DIAGNOSIS — I5033 Acute on chronic diastolic (congestive) heart failure: Secondary | ICD-10-CM | POA: Diagnosis not present

## 2017-06-06 DIAGNOSIS — M6281 Muscle weakness (generalized): Secondary | ICD-10-CM | POA: Diagnosis not present

## 2017-06-06 DIAGNOSIS — R2689 Other abnormalities of gait and mobility: Secondary | ICD-10-CM | POA: Diagnosis not present

## 2017-06-09 DIAGNOSIS — M6281 Muscle weakness (generalized): Secondary | ICD-10-CM | POA: Diagnosis not present

## 2017-06-09 DIAGNOSIS — I5033 Acute on chronic diastolic (congestive) heart failure: Secondary | ICD-10-CM | POA: Diagnosis not present

## 2017-06-09 DIAGNOSIS — R2689 Other abnormalities of gait and mobility: Secondary | ICD-10-CM | POA: Diagnosis not present

## 2017-06-10 DIAGNOSIS — R2689 Other abnormalities of gait and mobility: Secondary | ICD-10-CM | POA: Diagnosis not present

## 2017-06-10 DIAGNOSIS — I5033 Acute on chronic diastolic (congestive) heart failure: Secondary | ICD-10-CM | POA: Diagnosis not present

## 2017-06-10 DIAGNOSIS — M6281 Muscle weakness (generalized): Secondary | ICD-10-CM | POA: Diagnosis not present

## 2017-06-11 DIAGNOSIS — R2689 Other abnormalities of gait and mobility: Secondary | ICD-10-CM | POA: Diagnosis not present

## 2017-06-11 DIAGNOSIS — M6281 Muscle weakness (generalized): Secondary | ICD-10-CM | POA: Diagnosis not present

## 2017-06-11 DIAGNOSIS — I5033 Acute on chronic diastolic (congestive) heart failure: Secondary | ICD-10-CM | POA: Diagnosis not present

## 2017-06-12 DIAGNOSIS — I5033 Acute on chronic diastolic (congestive) heart failure: Secondary | ICD-10-CM | POA: Diagnosis not present

## 2017-06-12 DIAGNOSIS — R2689 Other abnormalities of gait and mobility: Secondary | ICD-10-CM | POA: Diagnosis not present

## 2017-06-12 DIAGNOSIS — M6281 Muscle weakness (generalized): Secondary | ICD-10-CM | POA: Diagnosis not present

## 2017-06-13 DIAGNOSIS — R2689 Other abnormalities of gait and mobility: Secondary | ICD-10-CM | POA: Diagnosis not present

## 2017-06-13 DIAGNOSIS — M6281 Muscle weakness (generalized): Secondary | ICD-10-CM | POA: Diagnosis not present

## 2017-06-13 DIAGNOSIS — I5033 Acute on chronic diastolic (congestive) heart failure: Secondary | ICD-10-CM | POA: Diagnosis not present

## 2017-06-16 DIAGNOSIS — M6281 Muscle weakness (generalized): Secondary | ICD-10-CM | POA: Diagnosis not present

## 2017-06-16 DIAGNOSIS — R2689 Other abnormalities of gait and mobility: Secondary | ICD-10-CM | POA: Diagnosis not present

## 2017-06-16 DIAGNOSIS — I5033 Acute on chronic diastolic (congestive) heart failure: Secondary | ICD-10-CM | POA: Diagnosis not present

## 2017-06-17 DIAGNOSIS — I5033 Acute on chronic diastolic (congestive) heart failure: Secondary | ICD-10-CM | POA: Diagnosis not present

## 2017-06-17 DIAGNOSIS — E1122 Type 2 diabetes mellitus with diabetic chronic kidney disease: Secondary | ICD-10-CM | POA: Diagnosis not present

## 2017-06-17 DIAGNOSIS — N179 Acute kidney failure, unspecified: Secondary | ICD-10-CM | POA: Diagnosis not present

## 2017-06-17 DIAGNOSIS — N183 Chronic kidney disease, stage 3 (moderate): Secondary | ICD-10-CM | POA: Diagnosis not present

## 2017-06-17 DIAGNOSIS — M6281 Muscle weakness (generalized): Secondary | ICD-10-CM | POA: Diagnosis not present

## 2017-06-17 DIAGNOSIS — R2689 Other abnormalities of gait and mobility: Secondary | ICD-10-CM | POA: Diagnosis not present

## 2017-06-17 DIAGNOSIS — Z794 Long term (current) use of insulin: Secondary | ICD-10-CM | POA: Diagnosis not present

## 2017-06-17 DIAGNOSIS — Z6841 Body Mass Index (BMI) 40.0 and over, adult: Secondary | ICD-10-CM | POA: Diagnosis not present

## 2017-06-17 DIAGNOSIS — I129 Hypertensive chronic kidney disease with stage 1 through stage 4 chronic kidney disease, or unspecified chronic kidney disease: Secondary | ICD-10-CM | POA: Diagnosis not present

## 2017-06-17 DIAGNOSIS — R600 Localized edema: Secondary | ICD-10-CM | POA: Diagnosis not present

## 2017-06-18 DIAGNOSIS — R2689 Other abnormalities of gait and mobility: Secondary | ICD-10-CM | POA: Diagnosis not present

## 2017-06-18 DIAGNOSIS — M6281 Muscle weakness (generalized): Secondary | ICD-10-CM | POA: Diagnosis not present

## 2017-06-18 DIAGNOSIS — I5033 Acute on chronic diastolic (congestive) heart failure: Secondary | ICD-10-CM | POA: Diagnosis not present

## 2017-06-19 DIAGNOSIS — R2689 Other abnormalities of gait and mobility: Secondary | ICD-10-CM | POA: Diagnosis not present

## 2017-06-19 DIAGNOSIS — I5033 Acute on chronic diastolic (congestive) heart failure: Secondary | ICD-10-CM | POA: Diagnosis not present

## 2017-06-19 DIAGNOSIS — M6281 Muscle weakness (generalized): Secondary | ICD-10-CM | POA: Diagnosis not present

## 2017-06-20 DIAGNOSIS — M6281 Muscle weakness (generalized): Secondary | ICD-10-CM | POA: Diagnosis not present

## 2017-06-20 DIAGNOSIS — I5033 Acute on chronic diastolic (congestive) heart failure: Secondary | ICD-10-CM | POA: Diagnosis not present

## 2017-06-20 DIAGNOSIS — R2689 Other abnormalities of gait and mobility: Secondary | ICD-10-CM | POA: Diagnosis not present

## 2017-06-23 DIAGNOSIS — J309 Allergic rhinitis, unspecified: Secondary | ICD-10-CM | POA: Diagnosis not present

## 2017-06-23 DIAGNOSIS — I10 Essential (primary) hypertension: Secondary | ICD-10-CM | POA: Diagnosis not present

## 2017-06-23 DIAGNOSIS — S81809A Unspecified open wound, unspecified lower leg, initial encounter: Secondary | ICD-10-CM | POA: Diagnosis not present

## 2017-06-23 DIAGNOSIS — I2510 Atherosclerotic heart disease of native coronary artery without angina pectoris: Secondary | ICD-10-CM | POA: Diagnosis not present

## 2017-06-23 DIAGNOSIS — R2689 Other abnormalities of gait and mobility: Secondary | ICD-10-CM | POA: Diagnosis not present

## 2017-06-23 DIAGNOSIS — E119 Type 2 diabetes mellitus without complications: Secondary | ICD-10-CM | POA: Diagnosis not present

## 2017-06-23 DIAGNOSIS — K5900 Constipation, unspecified: Secondary | ICD-10-CM | POA: Diagnosis not present

## 2017-06-23 DIAGNOSIS — I509 Heart failure, unspecified: Secondary | ICD-10-CM | POA: Diagnosis not present

## 2017-06-23 DIAGNOSIS — J209 Acute bronchitis, unspecified: Secondary | ICD-10-CM | POA: Diagnosis not present

## 2017-06-23 DIAGNOSIS — I5033 Acute on chronic diastolic (congestive) heart failure: Secondary | ICD-10-CM | POA: Diagnosis not present

## 2017-06-23 DIAGNOSIS — M6281 Muscle weakness (generalized): Secondary | ICD-10-CM | POA: Diagnosis not present

## 2017-06-23 DIAGNOSIS — H02403 Unspecified ptosis of bilateral eyelids: Secondary | ICD-10-CM | POA: Diagnosis not present

## 2017-06-23 DIAGNOSIS — R609 Edema, unspecified: Secondary | ICD-10-CM | POA: Diagnosis not present

## 2017-06-23 DIAGNOSIS — R062 Wheezing: Secondary | ICD-10-CM | POA: Diagnosis not present

## 2017-06-24 DIAGNOSIS — I5033 Acute on chronic diastolic (congestive) heart failure: Secondary | ICD-10-CM | POA: Diagnosis not present

## 2017-06-24 DIAGNOSIS — R2689 Other abnormalities of gait and mobility: Secondary | ICD-10-CM | POA: Diagnosis not present

## 2017-06-24 DIAGNOSIS — M6281 Muscle weakness (generalized): Secondary | ICD-10-CM | POA: Diagnosis not present

## 2017-06-25 DIAGNOSIS — I5033 Acute on chronic diastolic (congestive) heart failure: Secondary | ICD-10-CM | POA: Diagnosis not present

## 2017-06-25 DIAGNOSIS — M6281 Muscle weakness (generalized): Secondary | ICD-10-CM | POA: Diagnosis not present

## 2017-06-25 DIAGNOSIS — R2689 Other abnormalities of gait and mobility: Secondary | ICD-10-CM | POA: Diagnosis not present

## 2017-06-26 DIAGNOSIS — E119 Type 2 diabetes mellitus without complications: Secondary | ICD-10-CM | POA: Diagnosis not present

## 2017-06-26 DIAGNOSIS — M6281 Muscle weakness (generalized): Secondary | ICD-10-CM | POA: Diagnosis not present

## 2017-06-26 DIAGNOSIS — E1165 Type 2 diabetes mellitus with hyperglycemia: Secondary | ICD-10-CM | POA: Diagnosis not present

## 2017-06-26 DIAGNOSIS — R609 Edema, unspecified: Secondary | ICD-10-CM | POA: Diagnosis not present

## 2017-06-26 DIAGNOSIS — I10 Essential (primary) hypertension: Secondary | ICD-10-CM | POA: Diagnosis not present

## 2017-06-26 DIAGNOSIS — I509 Heart failure, unspecified: Secondary | ICD-10-CM | POA: Diagnosis not present

## 2017-06-26 DIAGNOSIS — I2510 Atherosclerotic heart disease of native coronary artery without angina pectoris: Secondary | ICD-10-CM | POA: Diagnosis not present

## 2017-06-26 DIAGNOSIS — I5033 Acute on chronic diastolic (congestive) heart failure: Secondary | ICD-10-CM | POA: Diagnosis not present

## 2017-06-26 DIAGNOSIS — R2689 Other abnormalities of gait and mobility: Secondary | ICD-10-CM | POA: Diagnosis not present

## 2017-06-28 DIAGNOSIS — R269 Unspecified abnormalities of gait and mobility: Secondary | ICD-10-CM | POA: Diagnosis not present

## 2017-06-28 DIAGNOSIS — R2681 Unsteadiness on feet: Secondary | ICD-10-CM | POA: Diagnosis not present

## 2017-06-28 DIAGNOSIS — R5383 Other fatigue: Secondary | ICD-10-CM | POA: Diagnosis not present

## 2017-06-28 DIAGNOSIS — B964 Proteus (mirabilis) (morganii) as the cause of diseases classified elsewhere: Secondary | ICD-10-CM | POA: Diagnosis not present

## 2017-06-28 DIAGNOSIS — G894 Chronic pain syndrome: Secondary | ICD-10-CM | POA: Diagnosis present

## 2017-06-28 DIAGNOSIS — Z7902 Long term (current) use of antithrombotics/antiplatelets: Secondary | ICD-10-CM | POA: Diagnosis not present

## 2017-06-28 DIAGNOSIS — I5033 Acute on chronic diastolic (congestive) heart failure: Secondary | ICD-10-CM | POA: Diagnosis present

## 2017-06-28 DIAGNOSIS — T83511A Infection and inflammatory reaction due to indwelling urethral catheter, initial encounter: Secondary | ICD-10-CM | POA: Diagnosis not present

## 2017-06-28 DIAGNOSIS — M79621 Pain in right upper arm: Secondary | ICD-10-CM | POA: Diagnosis not present

## 2017-06-28 DIAGNOSIS — R609 Edema, unspecified: Secondary | ICD-10-CM | POA: Diagnosis not present

## 2017-06-28 DIAGNOSIS — R0602 Shortness of breath: Secondary | ICD-10-CM | POA: Diagnosis not present

## 2017-06-28 DIAGNOSIS — M79605 Pain in left leg: Secondary | ICD-10-CM | POA: Diagnosis not present

## 2017-06-28 DIAGNOSIS — J9601 Acute respiratory failure with hypoxia: Secondary | ICD-10-CM | POA: Diagnosis not present

## 2017-06-28 DIAGNOSIS — R8271 Bacteriuria: Secondary | ICD-10-CM | POA: Diagnosis not present

## 2017-06-28 DIAGNOSIS — N189 Chronic kidney disease, unspecified: Secondary | ICD-10-CM | POA: Diagnosis not present

## 2017-06-28 DIAGNOSIS — J9611 Chronic respiratory failure with hypoxia: Secondary | ICD-10-CM | POA: Diagnosis not present

## 2017-06-28 DIAGNOSIS — N390 Urinary tract infection, site not specified: Secondary | ICD-10-CM | POA: Diagnosis not present

## 2017-06-28 DIAGNOSIS — E8770 Fluid overload, unspecified: Secondary | ICD-10-CM | POA: Diagnosis not present

## 2017-06-28 DIAGNOSIS — I5030 Unspecified diastolic (congestive) heart failure: Secondary | ICD-10-CM | POA: Diagnosis not present

## 2017-06-28 DIAGNOSIS — J309 Allergic rhinitis, unspecified: Secondary | ICD-10-CM | POA: Diagnosis not present

## 2017-06-28 DIAGNOSIS — J9612 Chronic respiratory failure with hypercapnia: Secondary | ICD-10-CM | POA: Diagnosis not present

## 2017-06-28 DIAGNOSIS — J449 Chronic obstructive pulmonary disease, unspecified: Secondary | ICD-10-CM | POA: Diagnosis present

## 2017-06-28 DIAGNOSIS — R0600 Dyspnea, unspecified: Secondary | ICD-10-CM | POA: Diagnosis not present

## 2017-06-28 DIAGNOSIS — I517 Cardiomegaly: Secondary | ICD-10-CM | POA: Diagnosis not present

## 2017-06-28 DIAGNOSIS — J9621 Acute and chronic respiratory failure with hypoxia: Secondary | ICD-10-CM | POA: Diagnosis not present

## 2017-06-28 DIAGNOSIS — N183 Chronic kidney disease, stage 3 (moderate): Secondary | ICD-10-CM | POA: Diagnosis present

## 2017-06-28 DIAGNOSIS — E873 Alkalosis: Secondary | ICD-10-CM | POA: Diagnosis not present

## 2017-06-28 DIAGNOSIS — E871 Hypo-osmolality and hyponatremia: Secondary | ICD-10-CM | POA: Diagnosis not present

## 2017-06-28 DIAGNOSIS — E119 Type 2 diabetes mellitus without complications: Secondary | ICD-10-CM | POA: Diagnosis not present

## 2017-06-28 DIAGNOSIS — R278 Other lack of coordination: Secondary | ICD-10-CM | POA: Diagnosis not present

## 2017-06-28 DIAGNOSIS — I2510 Atherosclerotic heart disease of native coronary artery without angina pectoris: Secondary | ICD-10-CM | POA: Diagnosis present

## 2017-06-28 DIAGNOSIS — N179 Acute kidney failure, unspecified: Secondary | ICD-10-CM | POA: Diagnosis not present

## 2017-06-28 DIAGNOSIS — I509 Heart failure, unspecified: Secondary | ICD-10-CM | POA: Diagnosis not present

## 2017-06-28 DIAGNOSIS — I519 Heart disease, unspecified: Secondary | ICD-10-CM | POA: Diagnosis not present

## 2017-06-28 DIAGNOSIS — R9431 Abnormal electrocardiogram [ECG] [EKG]: Secondary | ICD-10-CM | POA: Diagnosis not present

## 2017-06-28 DIAGNOSIS — I130 Hypertensive heart and chronic kidney disease with heart failure and stage 1 through stage 4 chronic kidney disease, or unspecified chronic kidney disease: Secondary | ICD-10-CM | POA: Diagnosis present

## 2017-06-28 DIAGNOSIS — J441 Chronic obstructive pulmonary disease with (acute) exacerbation: Secondary | ICD-10-CM | POA: Diagnosis not present

## 2017-06-28 DIAGNOSIS — Z9981 Dependence on supplemental oxygen: Secondary | ICD-10-CM | POA: Diagnosis not present

## 2017-06-28 DIAGNOSIS — R5381 Other malaise: Secondary | ICD-10-CM | POA: Diagnosis not present

## 2017-06-28 DIAGNOSIS — R457 State of emotional shock and stress, unspecified: Secondary | ICD-10-CM | POA: Diagnosis not present

## 2017-06-28 DIAGNOSIS — F419 Anxiety disorder, unspecified: Secondary | ICD-10-CM | POA: Diagnosis not present

## 2017-06-28 DIAGNOSIS — I110 Hypertensive heart disease with heart failure: Secondary | ICD-10-CM | POA: Diagnosis not present

## 2017-06-28 DIAGNOSIS — M2550 Pain in unspecified joint: Secondary | ICD-10-CM | POA: Diagnosis not present

## 2017-06-28 DIAGNOSIS — L02215 Cutaneous abscess of perineum: Secondary | ICD-10-CM | POA: Diagnosis not present

## 2017-06-28 DIAGNOSIS — M79641 Pain in right hand: Secondary | ICD-10-CM | POA: Diagnosis not present

## 2017-06-28 DIAGNOSIS — E1140 Type 2 diabetes mellitus with diabetic neuropathy, unspecified: Secondary | ICD-10-CM | POA: Diagnosis present

## 2017-06-28 DIAGNOSIS — G92 Toxic encephalopathy: Secondary | ICD-10-CM | POA: Diagnosis not present

## 2017-06-28 DIAGNOSIS — D649 Anemia, unspecified: Secondary | ICD-10-CM | POA: Diagnosis present

## 2017-06-28 DIAGNOSIS — Z6841 Body Mass Index (BMI) 40.0 and over, adult: Secondary | ICD-10-CM | POA: Diagnosis not present

## 2017-06-28 DIAGNOSIS — R531 Weakness: Secondary | ICD-10-CM | POA: Diagnosis not present

## 2017-06-28 DIAGNOSIS — Z7401 Bed confinement status: Secondary | ICD-10-CM | POA: Diagnosis not present

## 2017-06-28 DIAGNOSIS — F329 Major depressive disorder, single episode, unspecified: Secondary | ICD-10-CM | POA: Diagnosis not present

## 2017-06-28 DIAGNOSIS — R2689 Other abnormalities of gait and mobility: Secondary | ICD-10-CM | POA: Diagnosis not present

## 2017-06-28 DIAGNOSIS — E1122 Type 2 diabetes mellitus with diabetic chronic kidney disease: Secondary | ICD-10-CM | POA: Diagnosis present

## 2017-06-28 DIAGNOSIS — N9089 Other specified noninflammatory disorders of vulva and perineum: Secondary | ICD-10-CM | POA: Diagnosis not present

## 2017-06-28 DIAGNOSIS — M7989 Other specified soft tissue disorders: Secondary | ICD-10-CM | POA: Diagnosis not present

## 2017-06-28 DIAGNOSIS — B9689 Other specified bacterial agents as the cause of diseases classified elsewhere: Secondary | ICD-10-CM | POA: Diagnosis not present

## 2017-06-28 DIAGNOSIS — I4510 Unspecified right bundle-branch block: Secondary | ICD-10-CM | POA: Diagnosis not present

## 2017-06-28 DIAGNOSIS — Z79891 Long term (current) use of opiate analgesic: Secondary | ICD-10-CM | POA: Diagnosis not present

## 2017-06-28 DIAGNOSIS — E785 Hyperlipidemia, unspecified: Secondary | ICD-10-CM | POA: Diagnosis present

## 2017-06-28 DIAGNOSIS — Z794 Long term (current) use of insulin: Secondary | ICD-10-CM | POA: Diagnosis not present

## 2017-06-28 DIAGNOSIS — J302 Other seasonal allergic rhinitis: Secondary | ICD-10-CM | POA: Diagnosis present

## 2017-06-28 DIAGNOSIS — Z886 Allergy status to analgesic agent status: Secondary | ICD-10-CM | POA: Diagnosis not present

## 2017-06-28 DIAGNOSIS — Z960 Presence of urogenital implants: Secondary | ICD-10-CM | POA: Diagnosis not present

## 2017-06-28 DIAGNOSIS — J9602 Acute respiratory failure with hypercapnia: Secondary | ICD-10-CM | POA: Diagnosis not present

## 2017-06-28 DIAGNOSIS — I129 Hypertensive chronic kidney disease with stage 1 through stage 4 chronic kidney disease, or unspecified chronic kidney disease: Secondary | ICD-10-CM | POA: Diagnosis not present

## 2017-06-28 DIAGNOSIS — R0902 Hypoxemia: Secondary | ICD-10-CM | POA: Diagnosis not present

## 2017-08-07 DIAGNOSIS — D729 Disorder of white blood cells, unspecified: Secondary | ICD-10-CM | POA: Diagnosis not present

## 2017-08-07 DIAGNOSIS — R718 Other abnormality of red blood cells: Secondary | ICD-10-CM | POA: Diagnosis not present

## 2017-08-07 DIAGNOSIS — D649 Anemia, unspecified: Secondary | ICD-10-CM | POA: Diagnosis not present

## 2017-08-15 DIAGNOSIS — R7989 Other specified abnormal findings of blood chemistry: Secondary | ICD-10-CM | POA: Diagnosis not present

## 2017-08-18 DIAGNOSIS — R944 Abnormal results of kidney function studies: Secondary | ICD-10-CM | POA: Diagnosis not present

## 2018-08-11 IMAGING — CR DG CHEST 2V
2 series · 2 of 2 positions shown · non-contrast
Comparison: [facility]op[facility] Chest radiographs 09/06/2011

CLINICAL DATA: 66-year-old female status post fall backwards in
kitchen today. Chest and bilateral knee pain and swelling.

EXAM:
CHEST  2 VIEW

[chest lat]
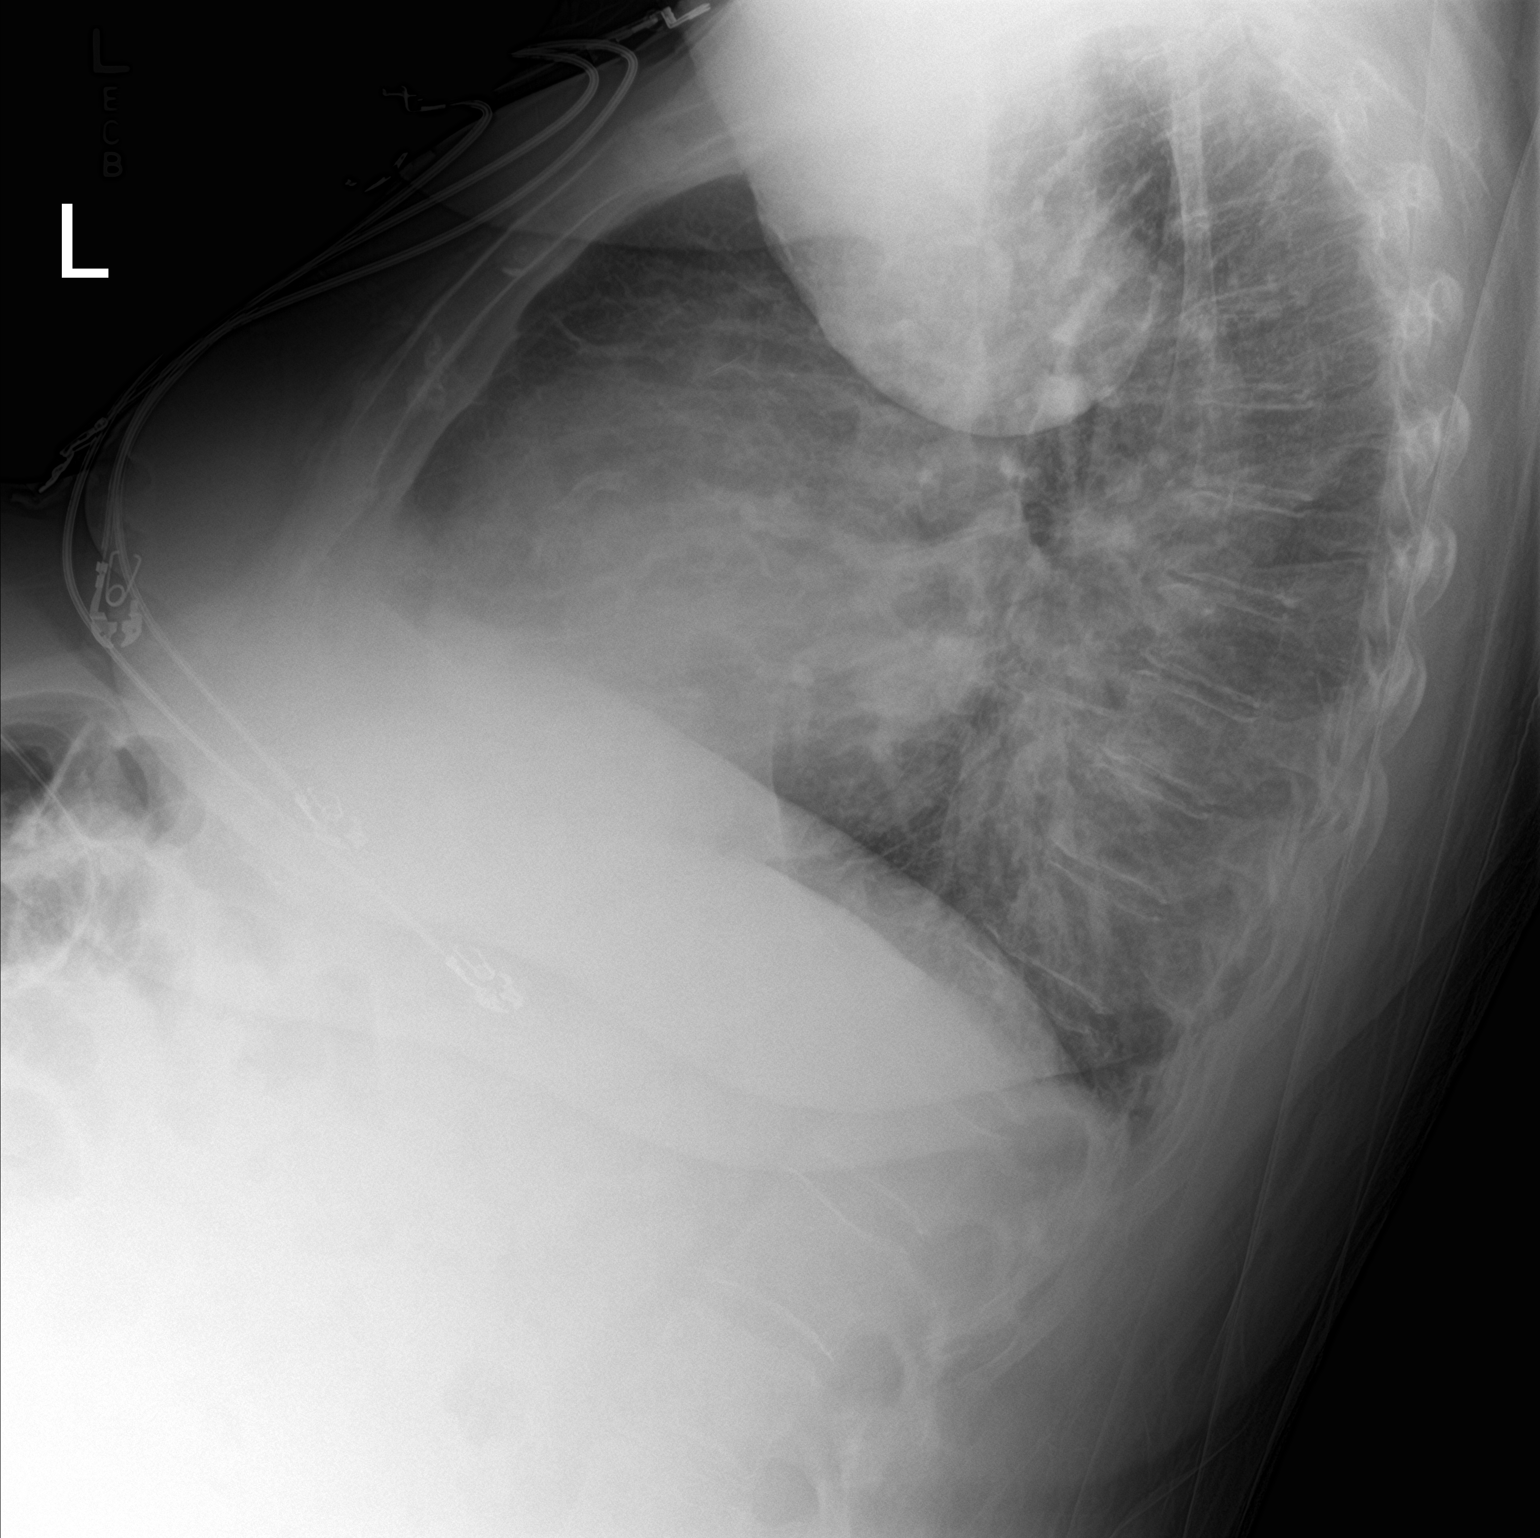

[chest ap]
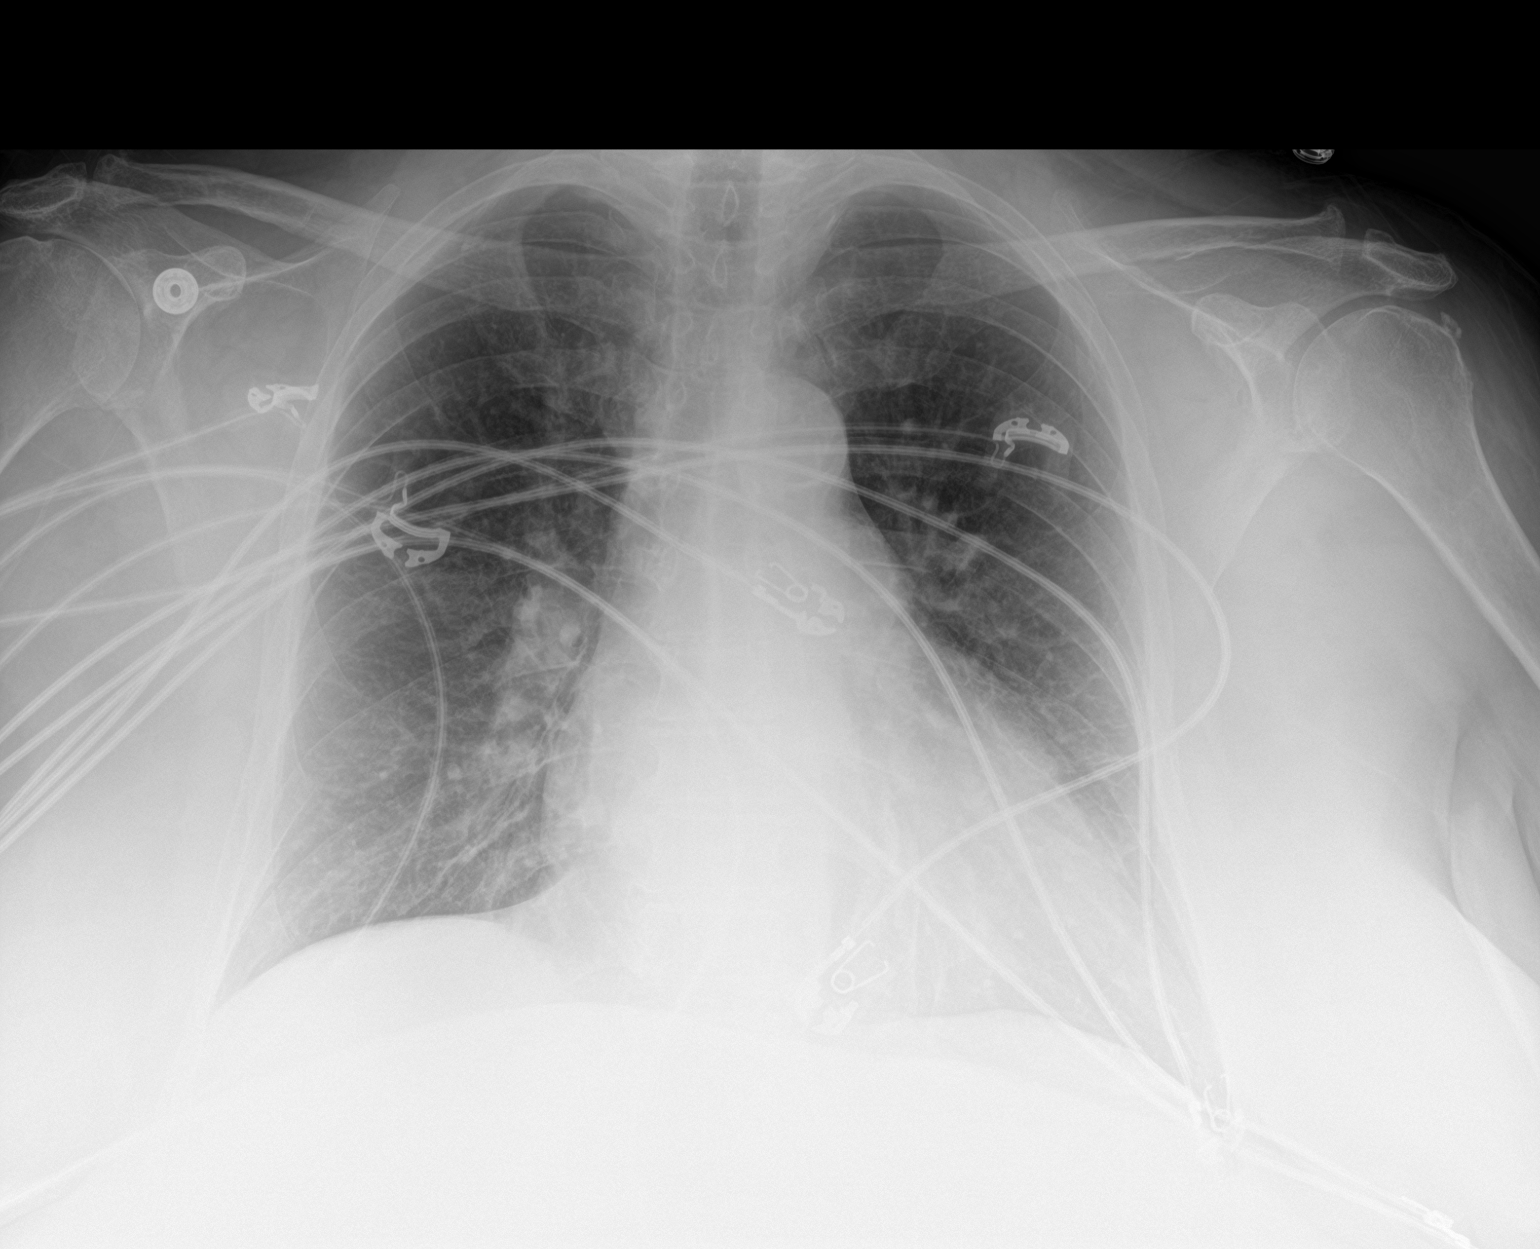

[2 of 2 positions shown; findings below may reference images not displayed]

FINDINGS: Seated upright AP and lateral views of the chest. Stable
cardiomegaly and mediastinal contours. Stable lung volumes. Chronic
increased pulmonary interstitial markings. No pneumothorax,
pulmonary edema, pleural effusion or acute pulmonary opacity. No
acute osseous abnormality identified. Negative visible bowel gas
pattern.
IMPRESSION: Stable cardiomegaly. No acute cardiopulmonary abnormality.

## 2018-08-11 IMAGING — CT CT HEAD W/O CM
3 of 7 series · 15 of 47 positions shown, 18 images · non-contrast
Comparison: Brain CT 09/21/2006

CLINICAL DATA: Patient status post fall. No reported loss
consciousness.

EXAM:
CT HEAD WITHOUT CONTRAST
CT CERVICAL SPINE WITHOUT CONTRAST
TECHNIQUE: Multidetector CT imaging of the head and cervical spine was
performed following the standard protocol without intravenous
contrast. Multiplanar CT image reconstructions of the cervical spine
were also generated.

[Series 6: head 3.0 mpr cor · coronal · 0.33mm/px · 3 of 69 slices shown]
[im 23/69  brain]
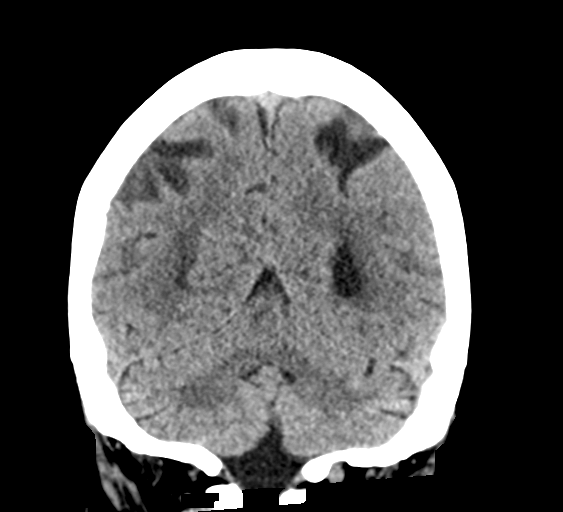
[im 35/69  brain]
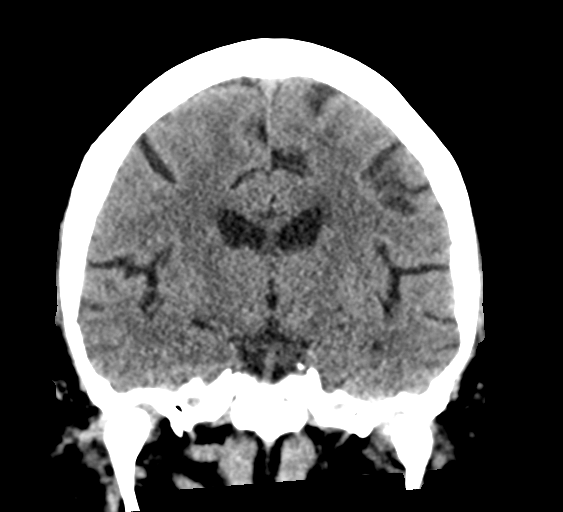
[im 46/69  brain]
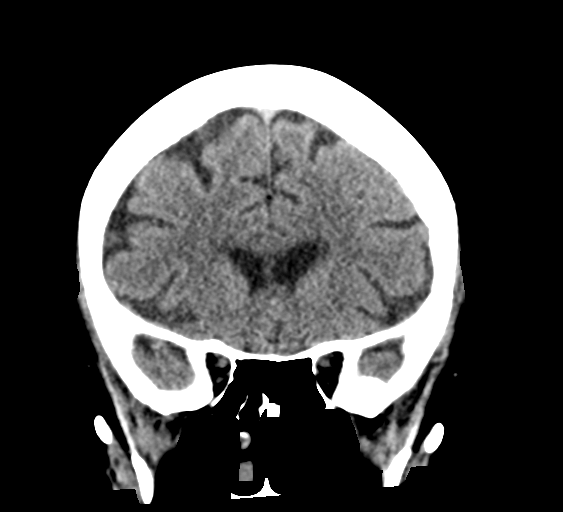

[Series 7: head 3.0 mpr sag · sagittal · 0.33mm/px · 1 of 58 slices shown]
[im 29/58  brain]
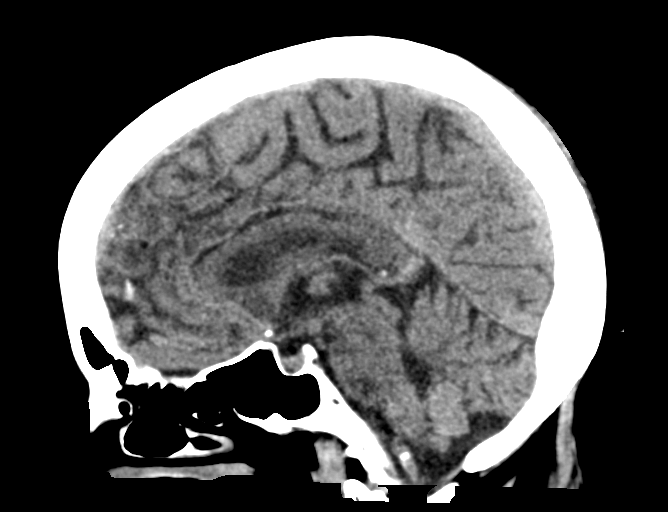

[Series 15: orthogonal axial st · axial · 0.21mm/px · z∈[-299,-163]mm · 11 of 100 slices shown, 14 images]
[im 9/100  brain]
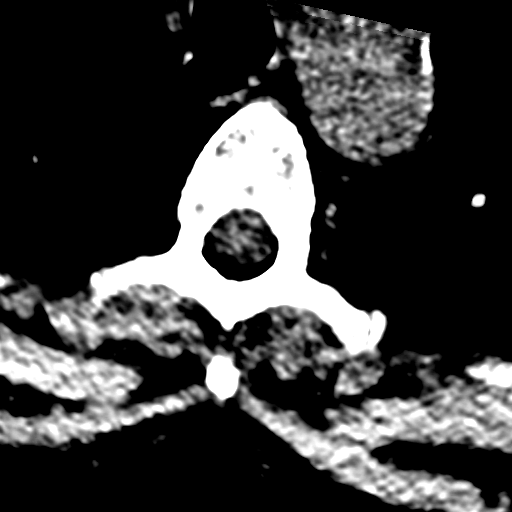
[im 9/100  bone]
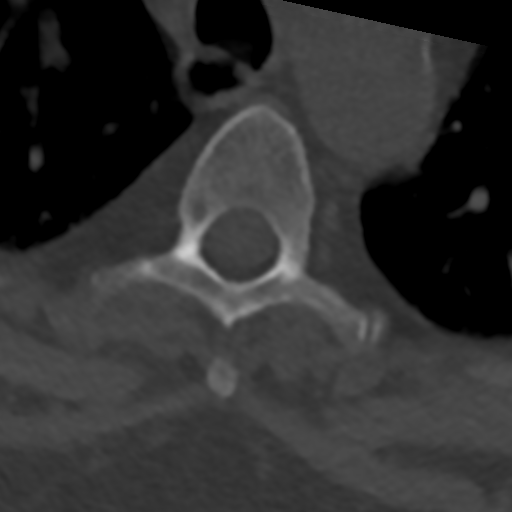
[im 17/100  brain]
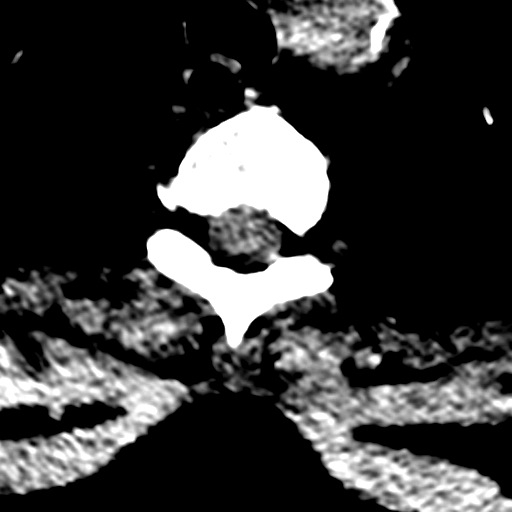
[im 25/100  brain]
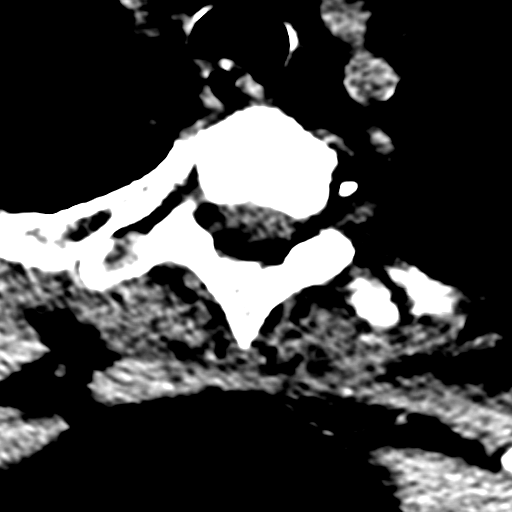
[im 34/100  brain]
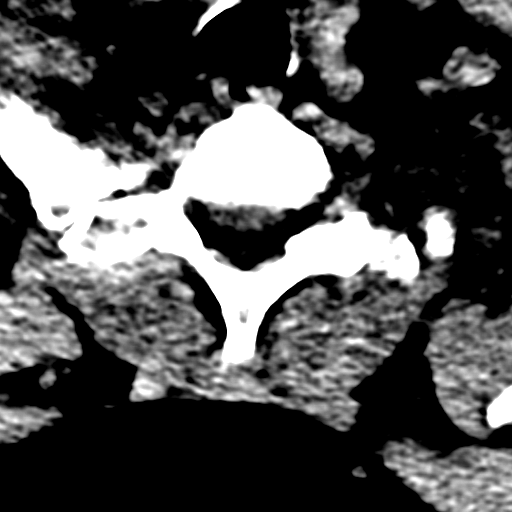
[im 42/100  brain]
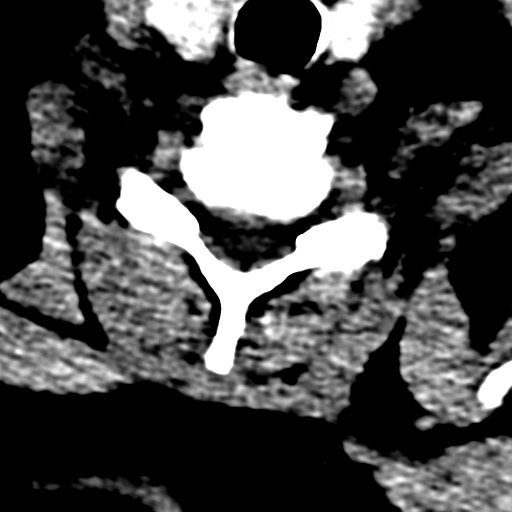
[im 42/100  bone]
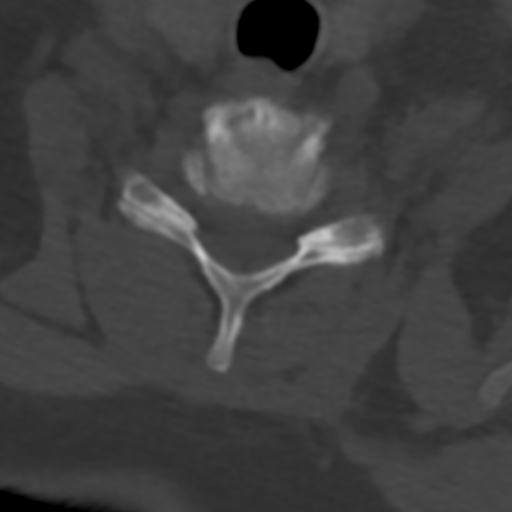
[im 50/100  brain]
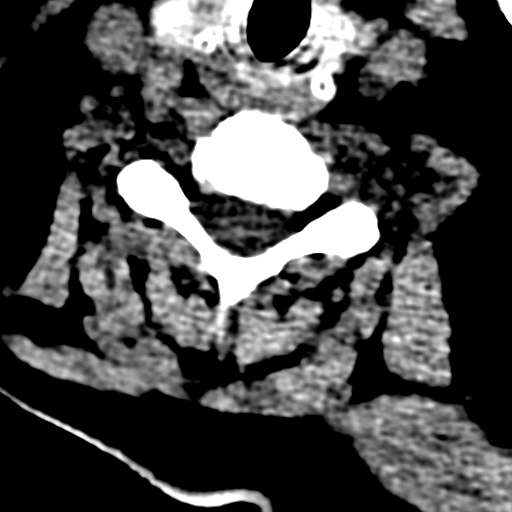
[im 58/100  brain]
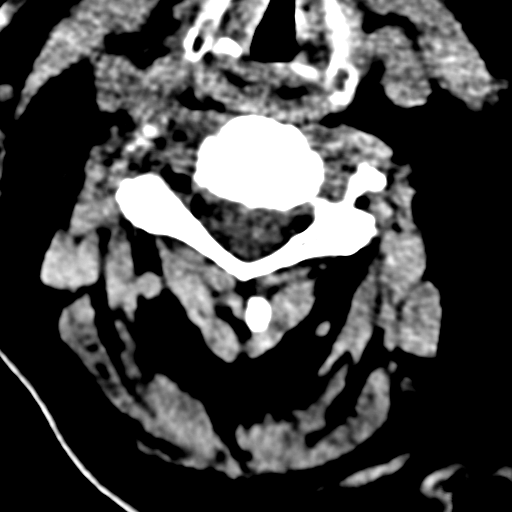
[im 67/100  brain]
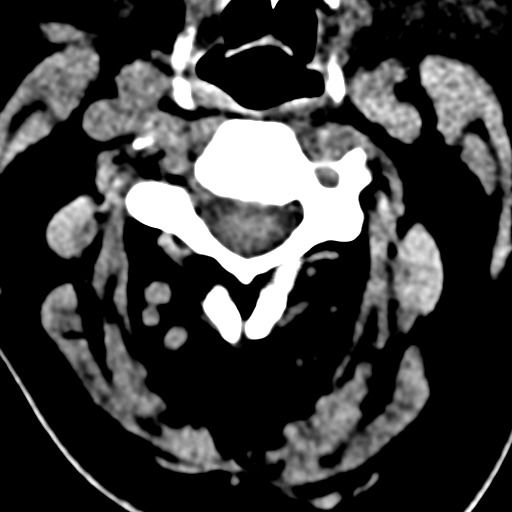
[im 75/100  brain]
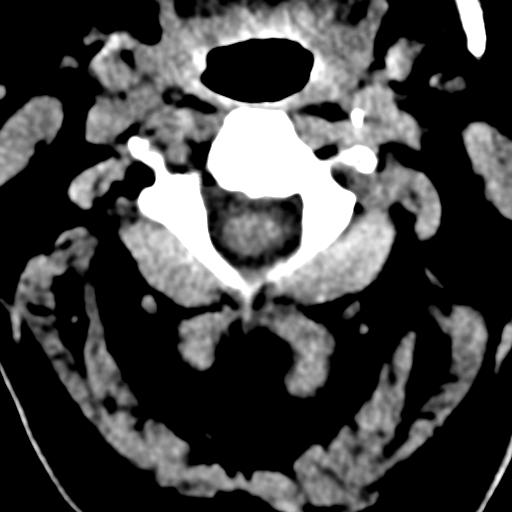
[im 75/100  bone]
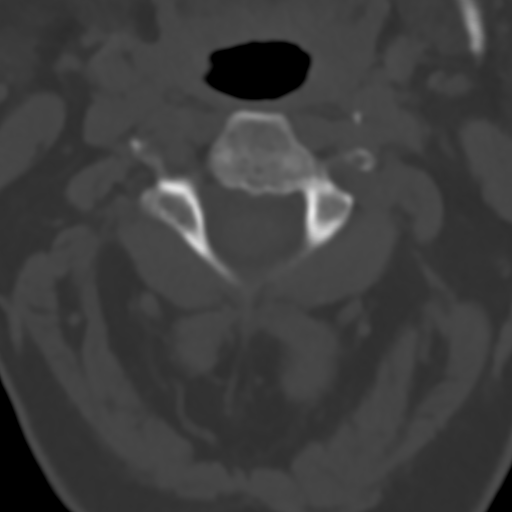
[im 83/100  brain]
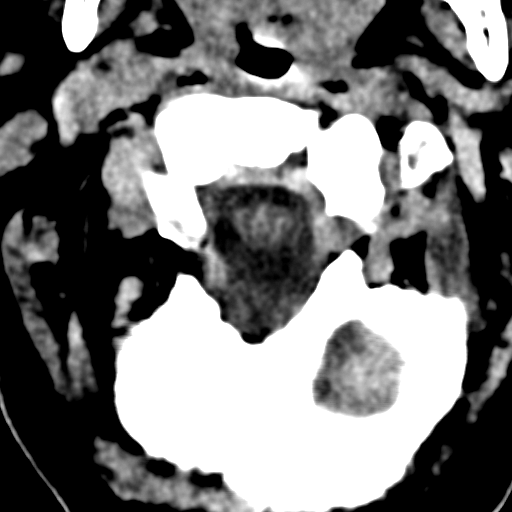
[im 91/100  brain]
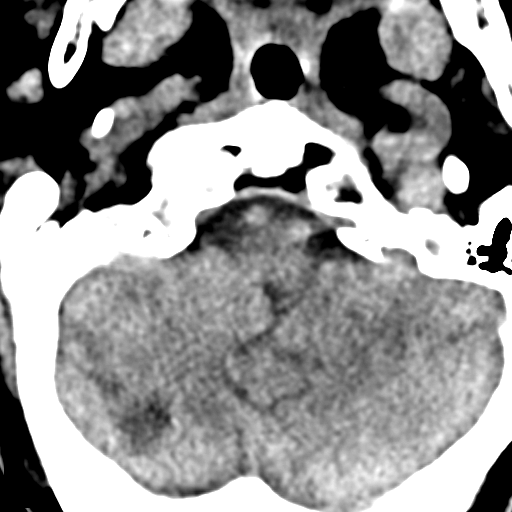

[15 of 47 positions shown; findings below may reference images not displayed]

FINDINGS: CT HEAD FINDINGS

Brain: Ventricles and sulci are appropriate for patient's age. No
evidence for acute cortically based infarct, intracranial
hemorrhage, mass lesion or mass-effect. Unchanged hypodensities
within the right cerebellar hemisphere and left basal ganglia
location which are nonspecific and may represent dilated prevascular
spaces or old lacunar infarcts.

Vascular: Internal carotid arterial vascular calcifications.

Skull: Intact.

Sinuses/Orbits: Paranasal sinuses are well aerated. Left mastoid air
cells are unremarkable. Under pneumatization of the right mastoid
air cells.

Other: Mild soft tissue swelling overlying the dorsal calvarium.

CT CERVICAL SPINE FINDINGS

Alignment: Normal.

Skull base and vertebrae: No acute fracture. No primary bone lesion
or focal pathologic process.

Soft tissues and spinal canal: No prevertebral fluid or swelling. No
visible canal hematoma.

Disc levels: Degenerative disc disease most pronounced C6-7 with
anterior endplate osteophytosis. No evidence for acute fracture.

Upper chest: Unremarkable

Other: None.
IMPRESSION: No acute intracranial process.

Mild soft tissue swelling overlying the dorsal calvarium.

No acute cervical spine fracture.  Degenerative disc disease.

## 2018-08-11 IMAGING — CR DG KNEE 1-2V*L*
2 series · 2 of 2 positions shown · non-contrast
Comparison: None.

CLINICAL DATA: 66-year-old female status post fall backwards in
kitchen today. Chest and bilateral knee pain and swelling.

EXAM:
LEFT KNEE - 1-2 VIEW

[knee ap]
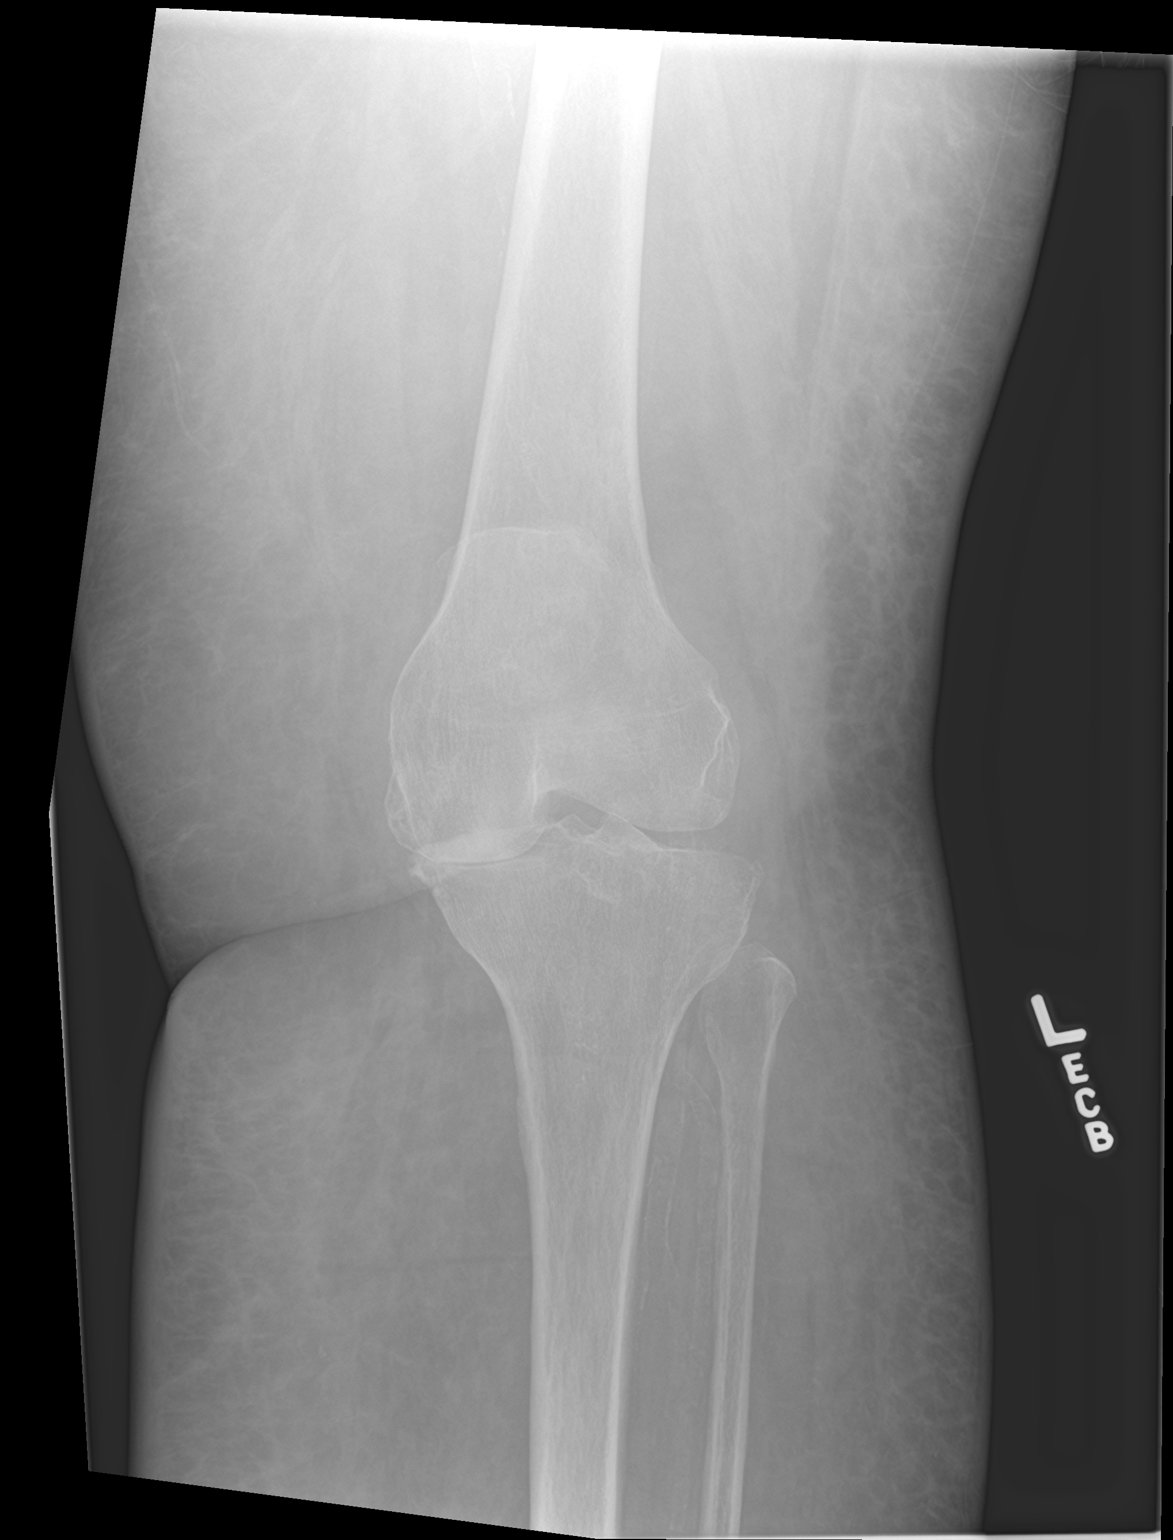

[knee lat]
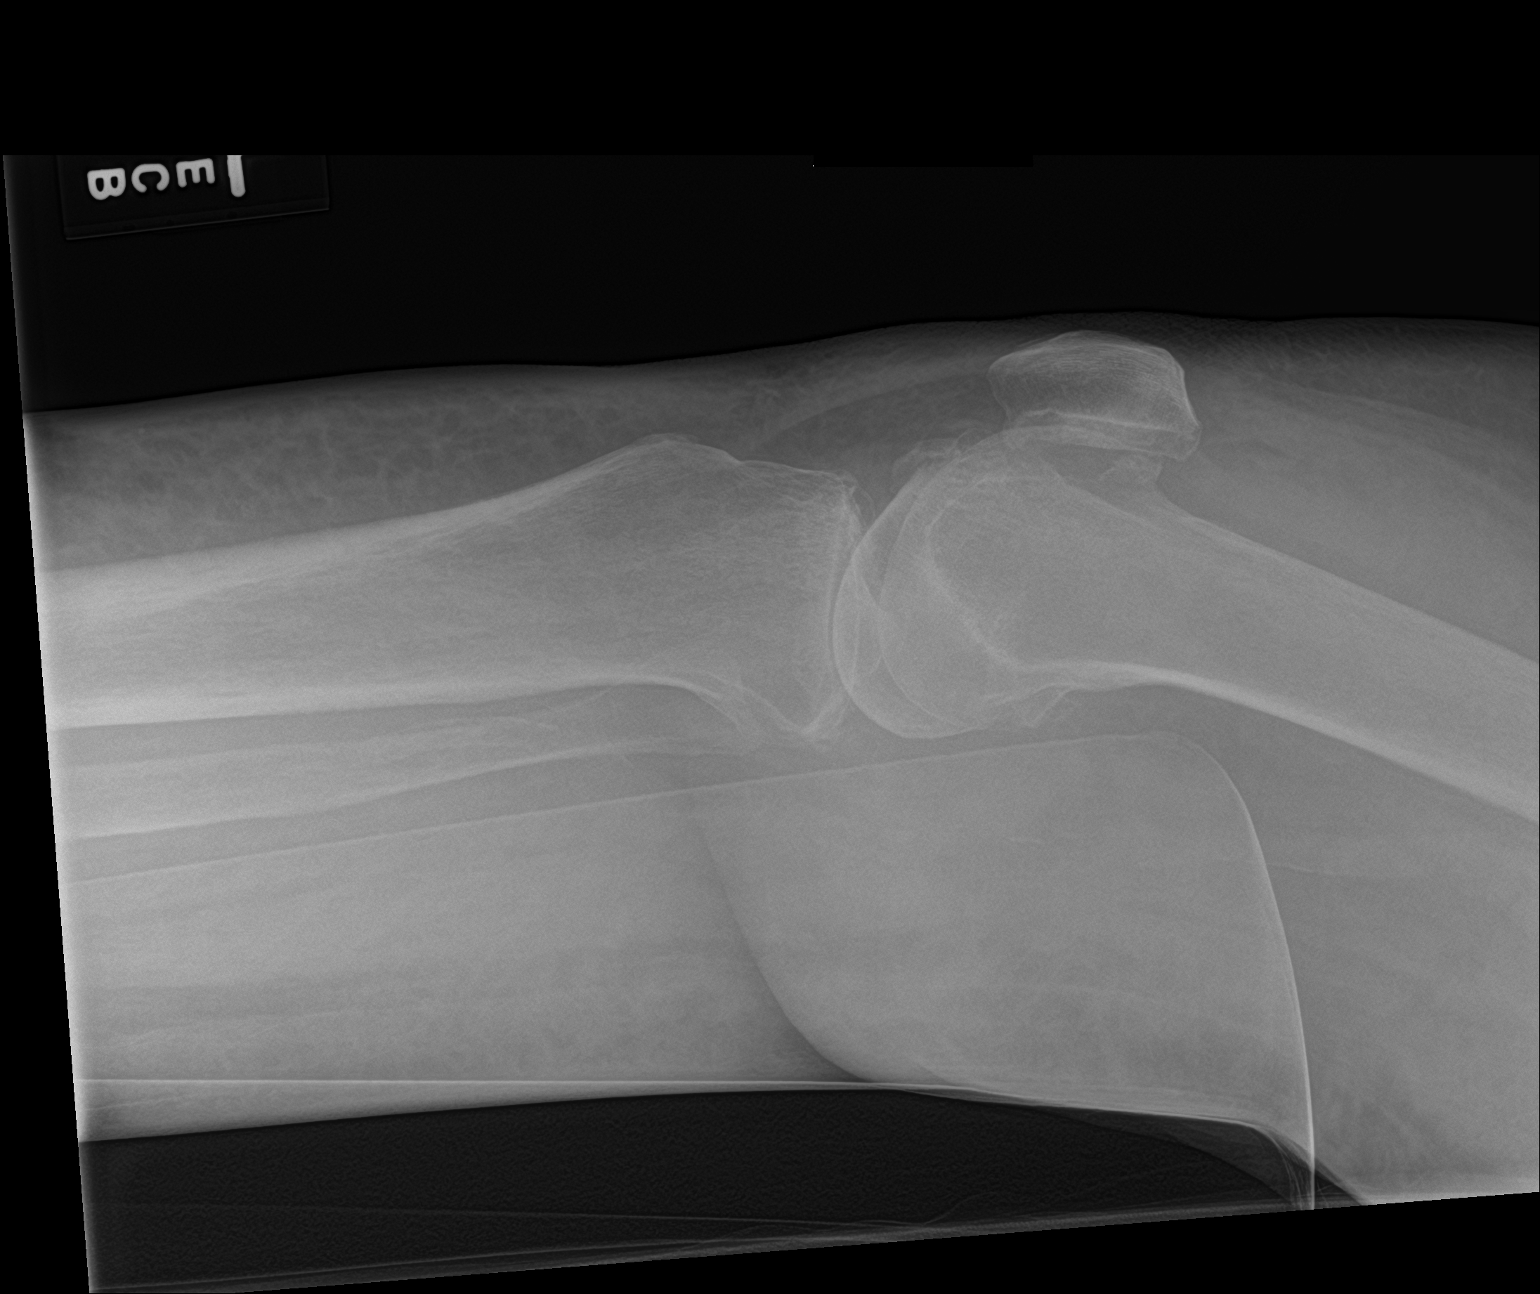

[2 of 2 positions shown; findings below may reference images not displayed]

FINDINGS: AP and cross-table lateral views. Chronic but progressed and severe
medial compartment joint space loss. Chronic tricompartmental
degenerative spurring. Small to moderate suprapatellar joint
effusion suspected. New cortical irregularity along the anterior
aspect of a femoral condyles cross-table lateral view (arrow).
Underlying chronic distal femoral degenerative spurring. The patella
appears intact. No proximal tibia or fibula fracture identified.
Calcified peripheral vascular disease. Generalized soft tissue
stranding and swelling.
IMPRESSION: 1. Difficult to exclude an acute fracture of the anterior left
femoral condyle. Knee MRI without contrast would probably be most
valuable at this point, but failing that a noncontrast knee CT could
evaluate further.
2. Joint effusion. Progressed and severe medial compartment joint
degeneration.
3. Nonspecific generalized right lower extremity soft tissue edema.
4. Calcified peripheral vascular disease.

## 2018-11-29 DEATH — deceased
